# Patient Record
Sex: Male | Born: 1985 | Race: White | Hispanic: No | Marital: Married | Smoking: Current every day smoker
Health system: Southern US, Community
[De-identification: ages and names within clinical notes are randomized; demographics above are authoritative.]

## PROBLEM LIST (undated history)

## (undated) DIAGNOSIS — Z87442 Personal history of urinary calculi: Secondary | ICD-10-CM

---
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
## 2009-10-19 ENCOUNTER — Emergency Department (HOSPITAL_COMMUNITY): Admission: EM | Admit: 2009-10-19 | Discharge: 2009-10-19 | Payer: Self-pay | Admitting: Emergency Medicine

## 2010-03-28 LAB — URINALYSIS, ROUTINE W REFLEX MICROSCOPIC
Glucose, UA: NEGATIVE mg/dL
Specific Gravity, Urine: 1.031 — ABNORMAL HIGH (ref 1.005–1.030)
pH: 7 (ref 5.0–8.0)

## 2010-03-28 LAB — URINE MICROSCOPIC-ADD ON

## 2011-01-19 ENCOUNTER — Emergency Department (HOSPITAL_COMMUNITY): Payer: Self-pay

## 2011-01-19 ENCOUNTER — Emergency Department (HOSPITAL_COMMUNITY)
Admission: EM | Admit: 2011-01-19 | Discharge: 2011-01-19 | Disposition: A | Discharge status: Home / Self Care | Payer: Self-pay | Attending: Emergency Medicine | Admitting: Emergency Medicine

## 2011-01-19 DIAGNOSIS — R51 Headache: Secondary | ICD-10-CM | POA: Insufficient documentation

## 2011-01-19 DIAGNOSIS — S8001XA Contusion of right knee, initial encounter: Secondary | ICD-10-CM

## 2011-01-19 DIAGNOSIS — S0990XA Unspecified injury of head, initial encounter: Secondary | ICD-10-CM | POA: Insufficient documentation

## 2011-01-19 DIAGNOSIS — IMO0002 Reserved for concepts with insufficient information to code with codable children: Secondary | ICD-10-CM | POA: Insufficient documentation

## 2011-01-19 DIAGNOSIS — S8000XA Contusion of unspecified knee, initial encounter: Secondary | ICD-10-CM | POA: Insufficient documentation

## 2011-01-19 DIAGNOSIS — R6884 Jaw pain: Secondary | ICD-10-CM | POA: Insufficient documentation

## 2011-01-19 MED ORDER — HYDROCODONE-ACETAMINOPHEN 5-500 MG PO TABS: 1.0000 | ORAL_TABLET | Freq: Four times a day (QID) | ORAL | Status: AC | PRN

## 2011-01-19 MED ORDER — IBUPROFEN 800 MG PO TABS: 800.0000 mg | ORAL_TABLET | Freq: Three times a day (TID) | ORAL | Status: AC

## 2011-01-19 MED ORDER — HYDROCODONE-ACETAMINOPHEN 5-325 MG PO TABS
1.0000 | ORAL_TABLET | Freq: Once | ORAL | Status: AC
  Administered 2011-01-19: 1 via ORAL
  Filled 2011-01-19: qty 1
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
## 2011-01-19 NOTE — ED Notes (Signed)
Denies dizziness, N/V reports hurts to open mouth- no deformity present upon exam

## 2011-01-20 NOTE — ED Provider Notes (Signed)
Medical screening examination/treatment/procedure(s) were performed by non-physician practitioner and as supervising physician I was immediately available for consultation/collaboration.  Lizette Pazos, MD 01/20/11 0120 
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
## 2013-04-06 ENCOUNTER — Encounter (HOSPITAL_COMMUNITY): Payer: Self-pay | Admitting: Emergency Medicine

## 2013-04-06 ENCOUNTER — Emergency Department (HOSPITAL_COMMUNITY)
Admission: EM | Admit: 2013-04-06 | Discharge: 2013-04-06 | Disposition: A | Discharge status: Home / Self Care | Payer: Self-pay | Attending: Emergency Medicine | Admitting: Emergency Medicine

## 2013-04-06 DIAGNOSIS — S0083XA Contusion of other part of head, initial encounter: Secondary | ICD-10-CM

## 2013-04-06 DIAGNOSIS — S1093XA Contusion of unspecified part of neck, initial encounter: Secondary | ICD-10-CM

## 2013-04-06 DIAGNOSIS — S301XXA Contusion of abdominal wall, initial encounter: Secondary | ICD-10-CM | POA: Insufficient documentation

## 2013-04-06 DIAGNOSIS — T07XXXA Unspecified multiple injuries, initial encounter: Secondary | ICD-10-CM

## 2013-04-06 DIAGNOSIS — S7010XA Contusion of unspecified thigh, initial encounter: Secondary | ICD-10-CM | POA: Insufficient documentation

## 2013-04-06 DIAGNOSIS — S0003XA Contusion of scalp, initial encounter: Secondary | ICD-10-CM | POA: Insufficient documentation

## 2013-04-06 DIAGNOSIS — S7000XA Contusion of unspecified hip, initial encounter: Secondary | ICD-10-CM | POA: Insufficient documentation

## 2013-04-06 DIAGNOSIS — F17200 Nicotine dependence, unspecified, uncomplicated: Secondary | ICD-10-CM | POA: Insufficient documentation

## 2013-04-06 NOTE — Discharge Instructions (Signed)
MULTIPLE AREAS OF ACUTE BRUISING REQUIRE SUPPORTIVE CARE ONLY, WITHOUT SUSPICION OF INTRA-ABDOMINAL OR BONY INJURY. FOLLOW UP WITH YOUR DOCTOR AS NEEDED.   Contusion A contusion is a deep bruise. Contusions are the result of an injury that caused bleeding under the skin. The contusion may turn blue, purple, or yellow. Minor injuries will give you a painless contusion, but more severe contusions may stay painful and swollen for a few weeks.  CAUSES  A contusion is usually caused by a blow, trauma, or direct force to an area of the body. SYMPTOMS   Swelling and redness of the injured area.  Bruising of the injured area.  Tenderness and soreness of the injured area.  Pain. DIAGNOSIS  The diagnosis can be made by taking a history and physical exam. An X-ray, CT scan, or MRI may be needed to determine if there were any associated injuries, such as fractures. TREATMENT  Specific treatment will depend on what area of the body was injured. In general, the best treatment for a contusion is resting, icing, elevating, and applying cold compresses to the injured area. Over-the-counter medicines may also be recommended for pain control. Ask your caregiver what the best treatment is for your contusion. HOME CARE INSTRUCTIONS   Put ice on the injured area.  Put ice in a plastic bag.  Place a towel between your skin and the bag.  Leave the ice on for 15-20 minutes, 03-04 times a day.  Only take over-the-counter or prescription medicines for pain, discomfort, or fever as directed by your caregiver. Your caregiver may recommend avoiding anti-inflammatory medicines (aspirin, ibuprofen, and naproxen) for 48 hours because these medicines may increase bruising.  Rest the injured area.  If possible, elevate the injured area to reduce swelling. SEEK IMMEDIATE MEDICAL CARE IF:   You have increased bruising or swelling.  You have pain that is getting worse.  Your swelling or pain is not relieved with  medicines. MAKE SURE YOU:   Understand these instructions.  Will watch your condition.  Will get help right away if you are not doing well or get worse. Document Released: 10/09/2004 Document Revised: 03/24/2011 Document Reviewed: 11/04/2010 [facility] Patient Information 2014 [place], [place].
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
## 2013-04-07 NOTE — ED Provider Notes (Signed)
Medical screening examination/treatment/procedure(s) were performed by non-physician practitioner and as supervising physician I was immediately available for consultation/collaboration.   EKG Interpretation None        Blanchard Kelch, MD 04/07/13 [phone]

## 2014-06-28 ENCOUNTER — Encounter (HOSPITAL_COMMUNITY): Payer: Self-pay

## 2014-06-28 ENCOUNTER — Emergency Department (HOSPITAL_COMMUNITY)
Admission: EM | Admit: 2014-06-28 | Discharge: 2014-06-28 | Disposition: A | Discharge status: Home / Self Care | Payer: Self-pay | Attending: Emergency Medicine | Admitting: Emergency Medicine

## 2014-06-28 DIAGNOSIS — Z720 Tobacco use: Secondary | ICD-10-CM | POA: Insufficient documentation

## 2014-06-28 DIAGNOSIS — R590 Localized enlarged lymph nodes: Secondary | ICD-10-CM | POA: Insufficient documentation

## 2014-06-28 DIAGNOSIS — R21 Rash and other nonspecific skin eruption: Secondary | ICD-10-CM | POA: Insufficient documentation

## 2014-06-28 MED ORDER — SULFAMETHOXAZOLE-TRIMETHOPRIM 800-160 MG PO TABS: 1.0000 | ORAL_TABLET | Freq: Two times a day (BID) | ORAL | Status: AC

## 2014-06-28 NOTE — ED Provider Notes (Signed)
CSN: [id number]     Arrival date & time 06/28/14  2030 History   First MD Initiated Contact with Patient 06/28/14 2043     Chief Complaint  Patient presents with  . Hair/Scalp Problem     (Consider location/radiation/quality/duration/timing/severity/associated sxs/prior Treatment) HPI Christopher Pennington is a 29 y.o. male who presents to the ED with "bumps" in his head. He states that he noted the areas about 3 months ago and over the past few days he has had green drainage from some of the areas. He states that he feels like the areas are pulsating.   History reviewed. No pertinent past medical history. History reviewed. No pertinent past surgical history. History reviewed. No pertinent family history. History  Substance Use Topics  . Smoking status: Current Every Day Smoker -- 1.00 packs/day    Types: Cigarettes  . Smokeless tobacco: Not on file  . Alcohol Use: Yes     Comment: occasionally    Review of Systems Negative except as stated in HPI   Allergies  Review of patient's allergies indicates no known allergies.  Home Medications   Prior to Admission medications   Medication Sig Start Date End Date Taking? Authorizing Provider  sulfamethoxazole-trimethoprim (BACTRIM DS,SEPTRA DS) 800-160 MG per tablet Take 1 tablet by mouth 2 (two) times daily. 06/28/14 07/05/14  Hope Bunnie Pion, NP   BP 142/83 mmHg  Pulse 73  Temp(Src) 97.7 F (36.5 C) (Oral)  Resp 18  Ht 5\' 10"  (1.778 m)  Wt 200 lb (90.719 kg)  BMI 28.70 kg/m2  SpO2 100% Physical Exam  Constitutional: He is oriented to person, place, and time. He appears well-developed and well-nourished.  HENT:  Multiple tiny red raised areas to the scalp. Few areas with drainage.   Eyes: Conjunctivae and EOM are normal.  Neck: Neck supple.  There are small posterior cervical nodes palpated.   Cardiovascular: Normal rate.   Pulmonary/Chest: Effort normal.  Musculoskeletal: Normal range of motion.  Lymphadenopathy:    He has  cervical adenopathy.  Neurological: He is alert and oriented to person, place, and time. No cranial nerve deficit.  Skin: Skin is warm and dry.  Psychiatric: He has a normal mood and affect. His behavior is normal.  Nursing note and vitals reviewed.   ED Course  Procedures (including critical care time) Labs Review  MDM  29 y.o. male with rash of the scalp that started 3 months ago and now has a few areas that are draining. Stable for d/c to follow up with dermatology. Doubt any immediate emergency exist since this has been an ongoing problem for the past 3 months. Patient is afebrile and does not appear toxic.  Final diagnoses:  Rash and nonspecific skin eruption      Ashley Murrain, NP 06/28/14 2106  Virgel Manifold, MD 06/30/14 1059

## 2014-06-28 NOTE — Discharge Instructions (Signed)
Your exam today shows that you have multiple small red raised bumps to the scalp. Some of the areas look infected. We are starting you on antibiotics and you will need to follow up with the dermatologist for further evaluation of what is causing the rash. Take the medication as directed.

## 2014-06-28 NOTE — ED Notes (Signed)
Patient states that 3 months ago he noticed "bumps" on his head and states they are increasing and having "green puss" draining. Patient states he feels like they are pulsating

## 2014-06-28 NOTE — ED Notes (Signed)
Pt reporting areas on scalp that fill with pus and burst.  Small white areas noted on pt's scalp, approximately the size of pencil tip.

## 2015-02-10 ENCOUNTER — Encounter (HOSPITAL_COMMUNITY): Payer: Self-pay | Admitting: *Deleted

## 2015-02-10 ENCOUNTER — Emergency Department (HOSPITAL_COMMUNITY)
Admission: EM | Admit: 2015-02-10 | Discharge: 2015-02-11 | Disposition: A | Discharge status: Home / Self Care | Payer: Self-pay | Attending: Emergency Medicine | Admitting: Emergency Medicine

## 2015-02-10 ENCOUNTER — Emergency Department (HOSPITAL_COMMUNITY): Payer: Self-pay

## 2015-02-10 DIAGNOSIS — Y998 Other external cause status: Secondary | ICD-10-CM | POA: Insufficient documentation

## 2015-02-10 DIAGNOSIS — S300XXA Contusion of lower back and pelvis, initial encounter: Secondary | ICD-10-CM | POA: Insufficient documentation

## 2015-02-10 DIAGNOSIS — F17210 Nicotine dependence, cigarettes, uncomplicated: Secondary | ICD-10-CM | POA: Insufficient documentation

## 2015-02-10 DIAGNOSIS — Y9289 Other specified places as the place of occurrence of the external cause: Secondary | ICD-10-CM | POA: Insufficient documentation

## 2015-02-10 DIAGNOSIS — T148XXA Other injury of unspecified body region, initial encounter: Secondary | ICD-10-CM

## 2015-02-10 DIAGNOSIS — W19XXXA Unspecified fall, initial encounter: Secondary | ICD-10-CM

## 2015-02-10 DIAGNOSIS — Y9389 Activity, other specified: Secondary | ICD-10-CM | POA: Insufficient documentation

## 2015-02-10 DIAGNOSIS — W08XXXA Fall from other furniture, initial encounter: Secondary | ICD-10-CM | POA: Insufficient documentation

## 2015-02-10 MED ORDER — OXYCODONE-ACETAMINOPHEN 5-325 MG PO TABS
1.0000 | ORAL_TABLET | Freq: Once | ORAL | Status: AC
  Administered 2015-02-10: 1 via ORAL
  Filled 2015-02-10: qty 1

## 2015-02-10 MED ORDER — KETOROLAC TROMETHAMINE 60 MG/2ML IM SOLN
60.0000 mg | Freq: Once | INTRAMUSCULAR | Status: AC
  Administered 2015-02-10: 60 mg via INTRAMUSCULAR
  Filled 2015-02-10: qty 2

## 2015-02-10 NOTE — ED Notes (Signed)
Pt fell off a bench and the leg of the bench struck in him the lower left side of his back.

## 2015-02-10 NOTE — ED Provider Notes (Signed)
CSN: [id number]     Arrival date & time 02/10/15  2310 History  By signing my name below, I, Arianna Nassar, attest that this documentation has been prepared under the direction and in the presence of Orpah Greek, MD. Electronically Signed: Julien Nordmann, ED Scribe. 02/10/2015. 11:26 PM.     Chief Complaint  Patient presents with  . Fall      The history is provided by the patient and the spouse. No language interpreter was used.   HPI Comments: Christopher Pennington is a 30 y.o. male who presents to the Emergency Department complaining of a fall that occurred two hours ago. He complains of left lower back pain that has associated ecchymosis and swelling around the area.  Pt states he was at a party standing on a bench when he fell and the leg of the bench hit him in his left lower back area. Pt notes it is extremely painful to lay down. He is currently under the influence of alcohol. Pt denies pain going down his legs and weakness.  No past medical history on file. No past surgical history on file. No family history on file. Social History  Substance Use Topics  . Smoking status: Current Every Day Smoker -- 1.00 packs/day    Types: Cigarettes  . Smokeless tobacco: Not on file  . Alcohol Use: Yes     Comment: occasionally    Review of Systems  Musculoskeletal: Positive for back pain.  Neurological: Negative for weakness.  All other systems reviewed and are negative.     Allergies  Review of patient's allergies indicates no known allergies.  Home Medications   Prior to Admission medications   Not on File   Triage vitals: BP 124/84 mmHg  Pulse 116  Temp(Src) 97.2 F (36.2 C) (Oral)  Ht 5\' 10"  (1.778 m)  Wt 210 lb (95.255 kg)  BMI 30.13 kg/m2  SpO2 98% Physical Exam  Constitutional: He is oriented to person, place, and time. He appears well-developed and well-nourished. No distress.  HENT:  Head: Normocephalic and atraumatic.  Right Ear: Hearing normal.  Left  Ear: Hearing normal.  Nose: Nose normal.  Mouth/Throat: Oropharynx is clear and moist and mucous membranes are normal.  Eyes: Conjunctivae and EOM are normal. Pupils are equal, round, and reactive to light.  Neck: Normal range of motion. Neck supple.  Cardiovascular: Regular rhythm, S1 normal and S2 normal.  Exam reveals no gallop and no friction rub.   No murmur heard. Pulmonary/Chest: Effort normal and breath sounds normal. No respiratory distress. He exhibits no tenderness.  Abdominal: Soft. Normal appearance and bowel sounds are normal. There is no hepatosplenomegaly. There is no tenderness. There is no rebound, no guarding, no tenderness at McBurney's point and negative Murphy's sign. No hernia.  Musculoskeletal: Normal range of motion. He exhibits tenderness.  4x15 ecchymotic area with surrounding swelling, contusion, and tenderness to the area of the left lower back, no significant midline tenderness  Neurological: He is alert and oriented to person, place, and time. He has normal strength. No cranial nerve deficit or sensory deficit. Coordination normal. GCS eye subscore is 4. GCS verbal subscore is 5. GCS motor subscore is 6.  Skin: Skin is warm, dry and intact. No rash noted. No cyanosis.  Psychiatric: He has a normal mood and affect. His speech is normal and behavior is normal. Thought content normal.  Nursing note and vitals reviewed.   ED Course  Procedures  DIAGNOSTIC STUDIES: Oxygen Saturation is 98% on  RA, normal by my interpretation.  COORDINATION OF CARE:  11:24 PM Discussed treatment plan with pt at bedside and pt agreed to plan.  Labs Review Labs Reviewed - No data to display  Imaging Review No results found. I have personally reviewed and evaluated these images and lab results as part of my medical decision-making.   EKG Interpretation None      MDM   Final diagnoses:  None   contusion Hematoma  Presents for evaluation of injury to left lower back.  Patient fell, landing on a bench directly on the low back. He has a large hematoma and contusion in the region. No blood in his urine to suggest renal contusion. X-ray negative. Patient feeling better with treatment. Continue analgesia, rest, ice.  I personally performed the services described in this documentation, which was scribed in my presence. The recorded information has been reviewed and is accurate.    Orpah Greek, MD 02/11/15 [id number]

## 2015-02-11 LAB — URINALYSIS, ROUTINE W REFLEX MICROSCOPIC
BILIRUBIN URINE: NEGATIVE
GLUCOSE, UA: NEGATIVE mg/dL
Hgb urine dipstick: NEGATIVE
KETONES UR: NEGATIVE mg/dL
Leukocytes, UA: NEGATIVE
NITRITE: NEGATIVE
PH: 5.5 (ref 5.0–8.0)
Protein, ur: NEGATIVE mg/dL
Specific Gravity, Urine: <1.005 — ABNORMAL LOW (ref 1.005–1.030)

## 2015-02-11 MED ORDER — IBUPROFEN 800 MG PO TABS: 800.0000 mg | ORAL_TABLET | Freq: Three times a day (TID) | ORAL | Status: DC

## 2015-02-11 MED ORDER — CYCLOBENZAPRINE HCL 10 MG PO TABS: 10.0000 mg | ORAL_TABLET | Freq: Three times a day (TID) | ORAL | Status: DC | PRN

## 2015-02-11 MED ORDER — HYDROCODONE-ACETAMINOPHEN 5-325 MG PO TABS: 2.0000 | ORAL_TABLET | ORAL | Status: DC | PRN

## 2015-02-11 NOTE — Discharge Instructions (Signed)
Cryotherapy °Cryotherapy means treatment with cold. Ice or gel packs can be used to reduce both pain and swelling. Ice is the most helpful within the first 24 to 48 hours after an injury or flare-up from overusing a muscle or joint. Sprains, strains, spasms, burning pain, shooting pain, and aches can all be eased with ice. Ice can also be used when recovering from surgery. Ice is effective, has very few side effects, and is safe for most people to use. °PRECAUTIONS  °Ice is not a safe treatment option for people with: °· Raynaud phenomenon. This is a condition affecting small blood vessels in the extremities. Exposure to cold may cause your problems to return. °· Cold hypersensitivity. There are many forms of cold hypersensitivity, including: °¨ Cold urticaria. Red, itchy hives appear on the skin when the tissues begin to warm after being iced. °¨ Cold erythema. This is a red, itchy rash caused by exposure to cold. °¨ Cold hemoglobinuria. Red blood cells break down when the tissues begin to warm after being iced. The hemoglobin that carry oxygen are passed into the urine because they cannot combine with blood proteins fast enough. °· Numbness or altered sensitivity in the area being iced. °If you have any of the following conditions, do not use ice until you have discussed cryotherapy with your caregiver: °· Heart conditions, such as arrhythmia, angina, or chronic heart disease. °· High blood pressure. °· Healing wounds or open skin in the area being iced. °· Current infections. °· Rheumatoid arthritis. °· Poor circulation. °· Diabetes. °Ice slows the blood flow in the region it is applied. This is beneficial when trying to stop inflamed tissues from spreading irritating chemicals to surrounding tissues. However, if you expose your skin to cold temperatures for too long or without the proper protection, you can damage your skin or nerves. Watch for signs of skin damage due to cold. °HOME CARE INSTRUCTIONS °Follow  these tips to use ice and cold packs safely. °· Place a dry or damp towel between the ice and skin. A damp towel will cool the skin more quickly, so you may need to shorten the time that the ice is used. °· For a more rapid response, add gentle compression to the ice. °· Ice for no more than 10 to 20 minutes at a time. The bonier the area you are icing, the less time it will take to get the benefits of ice. °· Check your skin after 5 minutes to make sure there are no signs of a poor response to cold or skin damage. °· Rest 20 minutes or more between uses. °· Once your skin is numb, you can end your treatment. You can test numbness by very lightly touching your skin. The touch should be so light that you do not see the skin dimple from the pressure of your fingertip. When using ice, most people will feel these normal sensations in this order: cold, burning, aching, and numbness. °· Do not use ice on someone who cannot communicate their responses to pain, such as small children or people with dementia. °HOW TO MAKE AN ICE PACK °Ice packs are the most common way to use ice therapy. Other methods include ice massage, ice baths, and cryosprays. Muscle creams that cause a cold, tingly feeling do not offer the same benefits that ice offers and should not be used as a substitute unless recommended by your caregiver. °To make an ice pack, do one of the following: °· Place crushed ice or a   bag of frozen vegetables in a sealable plastic bag. Squeeze out the excess air. Place this bag inside another plastic bag. Slide the bag into a pillowcase or place a damp towel between your skin and the bag.  Mix 3 parts water with 1 part rubbing alcohol. Freeze the mixture in a sealable plastic bag. When you remove the mixture from the freezer, it will be slushy. Squeeze out the excess air. Place this bag inside another plastic bag. Slide the bag into a pillowcase or place a damp towel between your skin and the bag. SEEK MEDICAL CARE  IF:  You develop white spots on your skin. This may give the skin a blotchy (mottled) appearance.  Your skin turns blue or pale.  Your skin becomes waxy or hard.  Your swelling gets worse. MAKE SURE YOU:   Understand these instructions.  Will watch your condition.  Will get help right away if you are not doing well or get worse.   This information is not intended to replace advice given to you by your health care provider. Make sure you discuss any questions you have with your health care provider.   Document Released: 08/26/2010 Document Revised: 01/20/2014 Document Reviewed: 08/26/2010 Elsevier Interactive Patient Education 2016 [employer] Hematoma A hematoma is a collection of blood under the skin, in an organ, in a body space, in a joint space, or in other tissue. The blood can clot to form a lump that you can see and feel. The lump is often firm and may sometimes become sore and tender. Most hematomas get better in a few days to weeks. However, some hematomas may be serious and require medical care. Hematomas can range in size from very small to very large. CAUSES  A hematoma can be caused by a blunt or penetrating injury. It can also be caused by spontaneous leakage from a blood vessel under the skin. Spontaneous leakage from a blood vessel is more likely to occur in older people, especially those taking blood thinners. Sometimes, a hematoma can develop after certain medical procedures. SIGNS AND SYMPTOMS   A firm lump on the body.  Possible pain and tenderness in the area.  Bruising.Blue, dark blue, purple-red, or yellowish skin may appear at the site of the hematoma if the hematoma is close to the surface of the skin. For hematomas in deeper tissues or body spaces, the signs and symptoms may be subtle. For example, an intra-abdominal hematoma may cause abdominal pain, weakness, fainting, and shortness of breath. An intracranial hematoma may cause a headache or symptoms  such as weakness, trouble speaking, or a change in consciousness. DIAGNOSIS  A hematoma can usually be diagnosed based on your medical history and a physical exam. Imaging tests may be needed if your health care provider suspects a hematoma in deeper tissues or body spaces, such as the abdomen, head, or chest. These tests may include ultrasonography or a CT scan.  TREATMENT  Hematomas usually go away on their own over time. Rarely does the blood need to be drained out of the body. Large hematomas or those that may affect vital organs will sometimes need surgical drainage or monitoring. HOME CARE INSTRUCTIONS   Apply ice to the injured area:   Put ice in a plastic bag.   Place a towel between your skin and the bag.   Leave the ice on for 20 minutes, 2-3 times a day for the first 1 to 2 days.   After the first 2 days, switch  to using warm compresses on the hematoma.   Elevate the injured area to help decrease pain and swelling. Wrapping the area with an elastic bandage may also be helpful. Compression helps to reduce swelling and promotes shrinking of the hematoma. Make sure the bandage is not wrapped too tight.   If your hematoma is on a lower extremity and is painful, crutches may be helpful for a couple days.   Only take over-the-counter or prescription medicines as directed by your health care provider. SEEK IMMEDIATE MEDICAL CARE IF:   You have increasing pain, or your pain is not controlled with medicine.   You have a fever.   You have worsening swelling or discoloration.   Your skin over the hematoma breaks or starts bleeding.   Your hematoma is in your chest or abdomen and you have weakness, shortness of breath, or a change in consciousness.  Your hematoma is on your scalp (caused by a fall or injury) and you have a worsening headache or a change in alertness or consciousness. MAKE SURE YOU:   Understand these instructions.  Will watch your condition.  Will  get help right away if you are not doing well or get worse.   This information is not intended to replace advice given to you by your health care provider. Make sure you discuss any questions you have with your health care provider.   Document Released: 08/14/2003 Document Revised: 09/01/2012 Document Reviewed: 06/09/2012 Elsevier Interactive Patient Education [employer].

## 2015-02-13 ENCOUNTER — Encounter (HOSPITAL_COMMUNITY): Payer: Self-pay | Admitting: Emergency Medicine

## 2015-02-13 ENCOUNTER — Emergency Department (HOSPITAL_COMMUNITY)
Admission: EM | Admit: 2015-02-13 | Discharge: 2015-02-13 | Disposition: A | Discharge status: Home / Self Care | Payer: Self-pay | Attending: Emergency Medicine | Admitting: Emergency Medicine

## 2015-02-13 DIAGNOSIS — S20229S Contusion of unspecified back wall of thorax, sequela: Secondary | ICD-10-CM

## 2015-02-13 DIAGNOSIS — Z791 Long term (current) use of non-steroidal anti-inflammatories (NSAID): Secondary | ICD-10-CM | POA: Insufficient documentation

## 2015-02-13 DIAGNOSIS — W08XXXS Fall from other furniture, sequela: Secondary | ICD-10-CM | POA: Insufficient documentation

## 2015-02-13 DIAGNOSIS — S300XXS Contusion of lower back and pelvis, sequela: Secondary | ICD-10-CM | POA: Insufficient documentation

## 2015-02-13 DIAGNOSIS — F17210 Nicotine dependence, cigarettes, uncomplicated: Secondary | ICD-10-CM | POA: Insufficient documentation

## 2015-02-13 NOTE — ED Provider Notes (Signed)
CSN: [id number]     Arrival date & time 02/13/15  1906 History   First MD Initiated Contact with Patient 02/13/15 1930     Chief Complaint  Patient presents with  . Back Pain     (Consider location/radiation/quality/duration/timing/severity/associated sxs/prior Treatment) HPI Comments: Patient sustained a fall on January 28. He was standing on a bench and fail. He sustained injury to the lower back, with a bruise. Bruise to the lower back is much larger than on Saturday. Pt states the hematoma is 3 times larger that it was on Saturday. Pt states he has only minimal pain. He worked today and noted the area larger than Saturday at the end of the day.  Patient is a 30 y.o. male presenting with back pain. The history is provided by the patient.  Back Pain Location:  Lumbar spine   History reviewed. No pertinent past medical history. History reviewed. No pertinent past surgical history. History reviewed. No pertinent family history. Social History  Substance Use Topics  . Smoking status: Current Every Day Smoker -- 1.00 packs/day    Types: Cigarettes  . Smokeless tobacco: None  . Alcohol Use: Yes     Comment: occasionally    Review of Systems  Musculoskeletal: Positive for back pain.  All other systems reviewed and are negative.     Allergies  Review of patient's allergies indicates no known allergies.  Home Medications   Prior to Admission medications   Medication Sig Start Date End Date Taking? Authorizing Provider  cyclobenzaprine (FLEXERIL) 10 MG tablet Take 1 tablet (10 mg total) by mouth 3 (three) times daily as needed for muscle spasms. 02/11/15   Orpah Greek, MD  HYDROcodone-acetaminophen (NORCO/VICODIN) 5-325 MG tablet Take 2 tablets by mouth every 4 (four) hours as needed for moderate pain. 02/11/15   Orpah Greek, MD  ibuprofen (ADVIL,MOTRIN) 800 MG tablet Take 1 tablet (800 mg total) by mouth 3 (three) times daily. 02/11/15   Orpah Greek,  MD   BP 133/76 mmHg  Pulse 78  Temp(Src) 97.8 F (36.6 C) (Oral)  Resp 16  Ht 5\' 10"  (1.778 m)  Wt 98.884 kg  BMI 31.28 kg/m2  SpO2 96% Physical Exam  Constitutional: He is oriented to person, place, and time. He appears well-developed and well-nourished.  Non-toxic appearance.  HENT:  Head: Normocephalic.  Right Ear: Tympanic membrane and external ear normal.  Left Ear: Tympanic membrane and external ear normal.  Eyes: EOM and lids are normal. Pupils are equal, round, and reactive to light.  Neck: Normal range of motion. Neck supple. Carotid bruit is not present.  Cardiovascular: Normal rate, regular rhythm, normal heart sounds, intact distal pulses and normal pulses.   Pulmonary/Chest: Breath sounds normal. No respiratory distress.  Abdominal: Soft. Bowel sounds are normal. There is no tenderness. There is no guarding.  Musculoskeletal: Normal range of motion.  Large bruise area of the upper buttocks and extending to the lower back. Palpable hematoma noted. No hot areas or red streaks.  Lymphadenopathy:       Head (right side): No submandibular adenopathy present.       Head (left side): No submandibular adenopathy present.    He has no cervical adenopathy.  Neurological: He is alert and oriented to person, place, and time. He has normal strength. No cranial nerve deficit or sensory deficit.  Pt is ambulatory without problem. No sensory deficit.   Skin: Skin is warm and dry.  Psychiatric: He has a normal mood and  affect. His speech is normal.  Nursing note and vitals reviewed.   ED Course  Procedures (including critical care time) Labs Review Labs Reviewed - No data to display  Imaging Review No results found. I have personally reviewed and evaluated these images and lab results as part of my medical decision-making.   EKG Interpretation None      MDM  Per the patient the bruise that was noted on January 28 is about 3 times larger than it was at that time. The  patient now became concerned because of the increasing in size. He states that the pain is actually somewhat better than it was previously. He is able to ambulate. He states that he went to work today, but after he got home and his family looked at the bruise they were very concerned and demanded that he come to the emergency department. His been no fever or chills reported. No neurologic deficits appreciated according to the patient.  I discussed with the patient in detail that the bruise on may spread, and that it may change colors. We discussed need to return to the emergency department if any temperature elevation, red streaks noted. Any problems with drainage, or changes in his neurologic status. Patient is in agreement with this discharge plan.    Final diagnoses:  Contusion of back, unspecified laterality, sequela    **I have reviewed nursing notes, vital signs, and all appropriate lab and imaging results for this patient.Lily Kocher, PA-C 02/14/15 1653  Noemi Chapel, MD 02/15/15 [phone]

## 2015-02-13 NOTE — Discharge Instructions (Signed)
Ice packs may be helpful. See your MD or return to ED if any fever or red streaks of infection from the hematoma area.

## 2015-02-13 NOTE — ED Notes (Signed)
Pt is worried about bruising to back from fall Saturday. Pt was seen for the same here.

## 2015-04-06 ENCOUNTER — Encounter (HOSPITAL_COMMUNITY): Payer: Self-pay

## 2015-04-06 ENCOUNTER — Emergency Department (HOSPITAL_COMMUNITY)
Admission: EM | Admit: 2015-04-06 | Discharge: 2015-04-06 | Disposition: A | Discharge status: Home / Self Care | Payer: Self-pay | Attending: Emergency Medicine | Admitting: Emergency Medicine

## 2015-04-06 DIAGNOSIS — F17210 Nicotine dependence, cigarettes, uncomplicated: Secondary | ICD-10-CM | POA: Insufficient documentation

## 2015-04-06 DIAGNOSIS — W228XXA Striking against or struck by other objects, initial encounter: Secondary | ICD-10-CM | POA: Insufficient documentation

## 2015-04-06 DIAGNOSIS — Z23 Encounter for immunization: Secondary | ICD-10-CM | POA: Insufficient documentation

## 2015-04-06 DIAGNOSIS — Y9389 Activity, other specified: Secondary | ICD-10-CM | POA: Insufficient documentation

## 2015-04-06 DIAGNOSIS — Y999 Unspecified external cause status: Secondary | ICD-10-CM | POA: Insufficient documentation

## 2015-04-06 DIAGNOSIS — S0990XA Unspecified injury of head, initial encounter: Secondary | ICD-10-CM | POA: Insufficient documentation

## 2015-04-06 DIAGNOSIS — S0181XA Laceration without foreign body of other part of head, initial encounter: Secondary | ICD-10-CM

## 2015-04-06 DIAGNOSIS — Y929 Unspecified place or not applicable: Secondary | ICD-10-CM | POA: Insufficient documentation

## 2015-04-06 DIAGNOSIS — S0121XA Laceration without foreign body of nose, initial encounter: Secondary | ICD-10-CM | POA: Insufficient documentation

## 2015-04-06 MED ORDER — LIDOCAINE HCL (PF) 1 % IJ SOLN
INTRAMUSCULAR | Status: AC
  Administered 2015-04-06: 5 mL
  Filled 2015-04-06: qty 5

## 2015-04-06 MED ORDER — LIDOCAINE HCL (PF) 1 % IJ SOLN
5.0000 mL | Freq: Once | INTRAMUSCULAR | Status: AC
  Administered 2015-04-06: 5 mL

## 2015-04-06 MED ORDER — TETANUS-DIPHTH-ACELL PERTUSSIS 5-2.5-18.5 LF-MCG/0.5 IM SUSP
0.5000 mL | Freq: Once | INTRAMUSCULAR | Status: AC
  Administered 2015-04-06: 0.5 mL via INTRAMUSCULAR
  Filled 2015-04-06: qty 0.5

## 2015-04-06 MED ORDER — BACITRACIN ZINC 500 UNIT/GM EX OINT
TOPICAL_OINTMENT | CUTANEOUS | Status: AC
  Administered 2015-04-06: 1
  Filled 2015-04-06: qty 0.9

## 2015-04-06 NOTE — ED Notes (Signed)
Pt has a laceration to across the bridge of his nose from a light fixture that fell while he was working on it.  Bleeding controlled with pressure applied.

## 2015-04-06 NOTE — Discharge Instructions (Signed)
Facial Laceration ° A facial laceration is a cut on the face. These injuries can be painful and cause bleeding. Lacerations usually heal quickly, but they need special care to reduce scarring. °DIAGNOSIS  °Your health care provider will take a medical history, ask for details about how the injury occurred, and examine the wound to determine how deep the cut is. °TREATMENT  °Some facial lacerations may not require closure. Others may not be able to be closed because of an increased risk of infection. The risk of infection and the chance for successful closure will depend on various factors, including the amount of time since the injury occurred. °The wound may be cleaned to help prevent infection. If closure is appropriate, pain medicines may be given if needed. Your health care provider will use stitches (sutures), wound glue (adhesive), or skin adhesive strips to repair the laceration. These tools bring the skin edges together to allow for faster healing and a better cosmetic outcome. If needed, you may also be given a tetanus shot. °HOME CARE INSTRUCTIONS °· Only take over-the-counter or prescription medicines as directed by your health care provider. °· Follow your health care provider's instructions for wound care. These instructions will vary depending on the technique used for closing the wound. °For Sutures: °· Keep the wound clean and dry.   °· If you were given a bandage (dressing), you should change it at least once a day. Also change the dressing if it becomes wet or dirty, or as directed by your health care provider.   °· Wash the wound with soap and water 2 times a day. Rinse the wound off with water to remove all soap. Pat the wound dry with a clean towel.   °· After cleaning, apply a thin layer of the antibiotic ointment recommended by your health care provider. This will help prevent infection and keep the dressing from sticking.   °· You may shower as usual after the first 24 hours. Do not soak the  wound in water until the sutures are removed.   °· Get your sutures removed as directed by your health care provider. With facial lacerations, sutures should usually be taken out after 4-5 days to avoid stitch marks.   °· Wait a few days after your sutures are removed before applying any makeup. °For Skin Adhesive Strips: °· Keep the wound clean and dry.   °· Do not get the skin adhesive strips wet. You may bathe carefully, using caution to keep the wound dry.   °· If the wound gets wet, pat it dry with a clean towel.   °· Skin adhesive strips will fall off on their own. You may trim the strips as the wound heals. Do not remove skin adhesive strips that are still stuck to the wound. They will fall off in time.   °For Wound Adhesive: °· You may briefly wet your wound in the shower or bath. Do not soak or scrub the wound. Do not swim. Avoid periods of heavy sweating until the skin adhesive has fallen off on its own. After showering or bathing, gently pat the wound dry with a clean towel.   °· Do not apply liquid medicine, cream medicine, ointment medicine, or makeup to your wound while the skin adhesive is in place. This may loosen the film before your wound is healed.   °· If a dressing is placed over the wound, be careful not to apply tape directly over the skin adhesive. This may cause the adhesive to be pulled off before the wound is healed.   °· Avoid   prolonged exposure to sunlight or tanning lamps while the skin adhesive is in place. °· The skin adhesive will usually remain in place for 5-10 days, then naturally fall off the skin. Do not pick at the adhesive film.   °After Healing: °Once the wound has healed, cover the wound with sunscreen during the day for 1 full year. This can help minimize scarring. Exposure to ultraviolet light in the first year will darken the scar. It can take 1-2 years for the scar to lose its redness and to heal completely.  °SEEK MEDICAL CARE IF: °· You have a fever. °SEEK IMMEDIATE  MEDICAL CARE IF: °· You have redness, pain, or swelling around the wound.   °· You see a yellowish-white fluid (pus) coming from the wound.   °  °This information is not intended to replace advice given to you by your health care provider. Make sure you discuss any questions you have with your health care provider. °  °Document Released: 02/07/2004 Document Revised: 01/20/2014 Document Reviewed: 08/12/2012 °Elsevier Interactive Patient Education ©2016 [employer] ° °Head Injury, Adult °You have a head injury. Headaches and throwing up (vomiting) are common after a head injury. It should be easy to wake up from sleeping. Sometimes you must stay in the hospital. Most problems happen within the first 24 hours. Side effects may occur up to 7-10 days after the injury.  °WHAT ARE THE TYPES OF HEAD INJURIES? °Head injuries can be as minor as a bump. Some head injuries can be more severe. More severe head injuries include: °· A jarring injury to the brain (concussion). °· A bruise of the brain (contusion). This mean there is bleeding in the brain that can cause swelling. °· A cracked skull (skull fracture). °· Bleeding in the brain that collects, clots, and forms a bump (hematoma). °WHEN SHOULD I GET HELP RIGHT AWAY?  °· You are confused or sleepy. °· You cannot be woken up. °· You feel sick to your stomach (nauseous) or keep throwing up (vomiting). °· Your dizziness or unsteadiness is getting worse. °· You have very bad, lasting headaches that are not helped by medicine. Take medicines only as told by your doctor. °· You cannot use your arms or legs like normal. °· You cannot walk. °· You notice changes in the black spots in the center of the colored part of your eye (pupil). °· You have clear or bloody fluid coming from your nose or ears. °· You have trouble seeing. °During the next 24 hours after the injury, you must stay with someone who can watch you. This person should get help right away (call 911 in the [place] if  you start to shake and are not able to control it (have seizures), you pass out, or you are unable to wake up. °HOW CAN I PREVENT A HEAD INJURY IN THE FUTURE? °· Wear seat belts. °· Wear a helmet while bike riding and playing sports like football. °· Stay away from dangerous activities around the house. °WHEN CAN I RETURN TO NORMAL ACTIVITIES AND ATHLETICS? °See your doctor before doing these activities. You should not do normal activities or play contact sports until 1 week after the following symptoms have stopped: °· Headache that does not go away. °· Dizziness. °· Poor attention. °· Confusion. °· Memory problems. °· Sickness to your stomach or throwing up. °· Tiredness. °· Fussiness. °· Bothered by bright lights or loud noises. °· Anxiousness or depression. °· Restless sleep. °MAKE SURE YOU:  °· Understand these instructions. °· Will   watch your condition. °· Will get help right away if you are not doing well or get worse. °  °This information is not intended to replace advice given to you by your health care provider. Make sure you discuss any questions you have with your health care provider. °  °Document Released: 12/13/2007 Document Revised: 01/20/2014 Document Reviewed: 09/06/2012 °Elsevier Interactive Patient Education ©2016 Elsevier Inc. ° °

## 2015-04-06 NOTE — ED Provider Notes (Signed)
TIME SEEN: 2:10 AM  CHIEF COMPLAINT: Head injury, facial laceration  HPI: Pt is a 30 y.o. male with no significant past medical history who reports that he was getting into bed tonight and was shaking his bedspread when it hit the light fixture on the ceiling knocking it down striking him in the nose. He denies loss of consciousness. No headache, vomiting, numbness or weakness on one side of his body. Not on anticoagulation or antiplatelet agents. He is unsure when his last tetanus vaccination was. No other injury.  ROS: See HPI Constitutional: no fever  Eyes: no drainage  ENT: no runny nose   Cardiovascular:  no chest pain  Resp: no SOB  GI: no vomiting GU: no dysuria Integumentary: no rash  Allergy: no hives  Musculoskeletal: no leg swelling  Neurological: no slurred speech ROS otherwise negative  PAST MEDICAL HISTORY/PAST SURGICAL HISTORY:  History reviewed. No pertinent past medical history.  MEDICATIONS:  Prior to Admission medications   Medication Sig Start Date End Date Taking? Authorizing Provider  cyclobenzaprine (FLEXERIL) 10 MG tablet Take 1 tablet (10 mg total) by mouth 3 (three) times daily as needed for muscle spasms. 02/11/15   Orpah Greek, MD  HYDROcodone-acetaminophen (NORCO/VICODIN) 5-325 MG tablet Take 2 tablets by mouth every 4 (four) hours as needed for moderate pain. 02/11/15   Orpah Greek, MD  ibuprofen (ADVIL,MOTRIN) 800 MG tablet Take 1 tablet (800 mg total) by mouth 3 (three) times daily. 02/11/15   Orpah Greek, MD    ALLERGIES:  No Known Allergies  SOCIAL HISTORY:  Social History  Substance Use Topics  . Smoking status: Current Every Day Smoker -- 1.00 packs/day    Types: Cigarettes  . Smokeless tobacco: Not on file  . Alcohol Use: Yes     Comment: occasionally    FAMILY HISTORY: No family history on file.  EXAM: BP 122/75 mmHg  Pulse 58  Temp(Src) 97.7 F (36.5 C) (Oral)  Resp 18  Ht 5\' 10"  (1.778 m)  Wt 200  lb (90.719 kg)  BMI 28.70 kg/m2  SpO2 100% CONSTITUTIONAL: Alert and oriented and responds appropriately to questions. Well-appearing; well-nourished HEAD: Normocephalic, patient has a 2.7 cm laceration to the bridge of his nose that is superficial and some swelling and ecchymosis around the nose, otherwise it is atraumatic EYES: Conjunctivae clear, PERRL ENT: normal nose; no rhinorrhea; moist mucous membranes, no dental injury, no septal hematoma, no epistaxis NECK: Supple, no meningismus, no LAD, no midline spinal tenderness or step-off or deformity  CARD: RRR; S1 and S2 appreciated; no murmurs, no clicks, no rubs, no gallops RESP: Normal chest excursion without splinting or tachypnea; breath sounds clear and equal bilaterally; no wheezes, no rhonchi, no rales, no hypoxia or respiratory distress, speaking full sentences ABD/GI: Normal bowel sounds; non-distended; soft, non-tender, no rebound, no guarding, no peritoneal signs BACK:  The back appears normal and is non-tender to palpation, there is no CVA tenderness, no midline spinal tenderness or step-off or deformity EXT: Normal ROM in all joints; non-tender to palpation; no edema; normal capillary refill; no cyanosis, no calf tenderness or swelling    SKIN: Normal color for age and race; warm; no rash NEURO: Moves all extremities equally, sensation to light touch intact diffusely, cranial nerves II through XII intact, normal gait PSYCH: The patient's mood and manner are appropriate. Grooming and personal hygiene are appropriate.  MEDICAL DECISION MAKING: Patient here with head injury. Has a laceration to the bridge of his nose. Discussed  with him that his nose is swollen and could be fractured but doubt any other facial fracture. He has no other facial tenderness on exam. Have offered him imaging of his face but he declines. Doubt intracranial hemorrhage or skull fracture. I do not feel this time he needs a CT of his head. He is neurologically  intact. However. His laceration and updated his tetanus vaccination. Discussed head injury return precautions and discussed laceration care instructions.   I do not feel there is any life-threatening condition present. I have reviewed and discussed all results (EKG, imaging, lab, urine as appropriate), exam findings with patient. I have reviewed nursing notes and appropriate previous records.  I feel the patient is safe to be discharged home without further emergent workup. Discussed usual and customary return precautions. Patient and family (if present) verbalize understanding and are comfortable with this plan.  Patient will follow-up with their primary care provider. If they do not have a primary care provider, information for follow-up has been provided to them. All questions have been answered.      LACERATION REPAIR Performed by: Nyra Jabs Authorized by: Nyra Jabs Consent: Verbal consent obtained. Risks and benefits: risks, benefits and alternatives were discussed Consent given by: patient Patient identity confirmed: provided demographic data Prepped and Draped in normal sterile fashion Wound explored  Laceration Location: Nose  Laceration Length: 2.7 cm  No Foreign Bodies seen or palpated  Anesthesia: local infiltration  Local anesthetic: lidocaine 1 % without epinephrine  Anesthetic total: 3 ml  Irrigation method: syringe Amount of cleaning: standard  Skin closure: Superficial  Number of sutures: 3  Technique: Area anesthetized using lidocaine 1% without epinephrine. Wound irrigated copiously with sterile saline. Wound then cleaned with Betadine and draped in sterile fashion. Wound closed using 3 simple interrupted sutures using 50 absorbable suture.  Bacitracin and sterile dressing applied. Good wound approximation and hemostasis achieved.    Patient tolerance: Patient tolerated the procedure well with no immediate complications.    [place],  DO 04/06/15 [phone]

## 2017-01-31 IMAGING — DX DG LUMBAR SPINE 2-3V
2 series · 2 of 2 positions shown · non-contrast
Comparison: None.

CLINICAL DATA: Status post fall 2 hours ago, with left lower back
pain and ecchymosis. Hit left lower back on leg of bench. Initial
encounter.

EXAM:
LUMBAR SPINE - 2-3 VIEW

[l-spine ap]
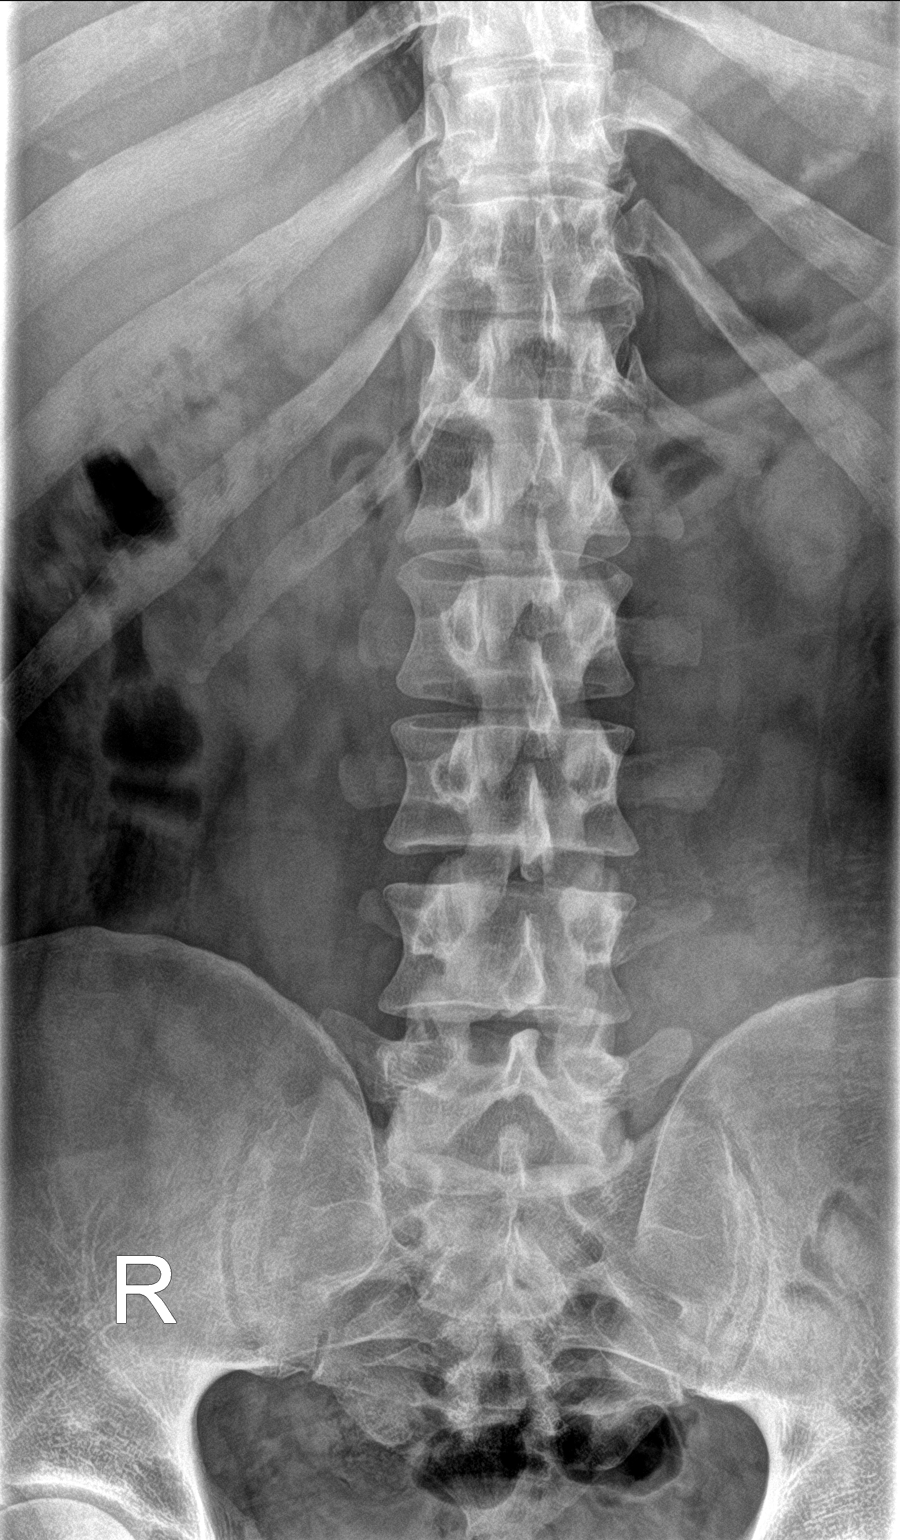

[l-spine obl]
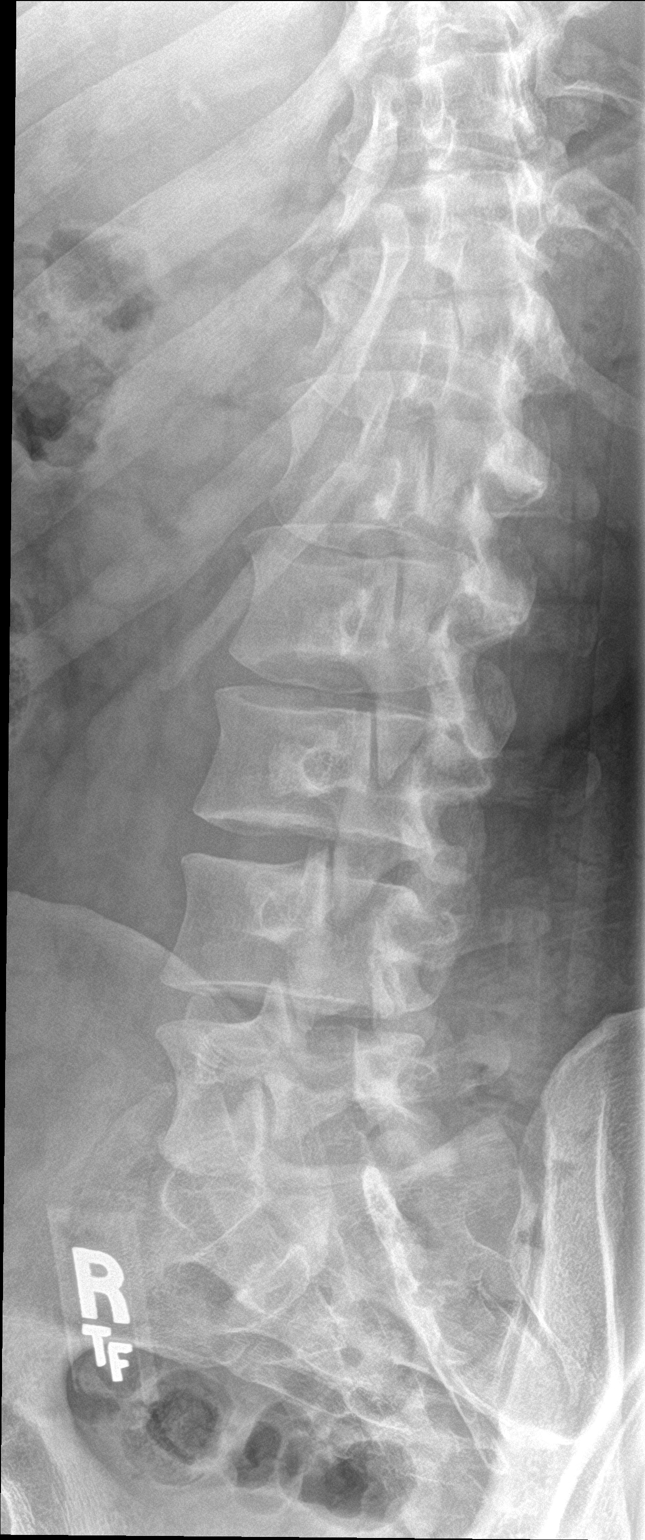

[2 of 2 positions shown; findings below may reference images not displayed]

FINDINGS: There is no evidence of fracture or subluxation. Vertebral bodies
demonstrate normal height and alignment. Intervertebral disc spaces
are preserved. Evaluation is suboptimal as the patient agreed to
only two views -- an AP view and 1 oblique.

The visualized bowel gas pattern is unremarkable in appearance; air
and stool are noted within the colon. The sacroiliac joints are
within normal limits.
IMPRESSION: No evidence of fracture or subluxation along the lumbar spine.
Evaluation is suboptimal as the patient agreed to only the first two
views.

## 2017-04-30 ENCOUNTER — Emergency Department (HOSPITAL_COMMUNITY)
Admission: EM | Admit: 2017-04-30 | Discharge: 2017-05-01 | Disposition: A | Discharge status: Home / Self Care | Payer: Self-pay | Attending: Emergency Medicine | Admitting: Emergency Medicine

## 2017-04-30 ENCOUNTER — Encounter (HOSPITAL_COMMUNITY): Payer: Self-pay

## 2017-04-30 DIAGNOSIS — J029 Acute pharyngitis, unspecified: Secondary | ICD-10-CM | POA: Insufficient documentation

## 2017-04-30 DIAGNOSIS — Z5321 Procedure and treatment not carried out due to patient leaving prior to being seen by health care provider: Secondary | ICD-10-CM | POA: Insufficient documentation

## 2017-04-30 LAB — GROUP A STREP BY PCR: GROUP A STREP BY PCR: DETECTED — AB

## 2017-04-30 NOTE — ED Triage Notes (Signed)
Pt presents with onset of difficulty swallowing that began today.  Pt denies any cold symptoms, reports bilateral ear pain.

## 2018-08-12 ENCOUNTER — Other Ambulatory Visit: Payer: Self-pay

## 2018-08-12 ENCOUNTER — Emergency Department (HOSPITAL_COMMUNITY)
Admission: EM | Admit: 2018-08-12 | Discharge: 2018-08-12 | Disposition: A | Discharge status: Home / Self Care | Payer: Self-pay | Attending: Emergency Medicine | Admitting: Emergency Medicine

## 2018-08-12 DIAGNOSIS — F17210 Nicotine dependence, cigarettes, uncomplicated: Secondary | ICD-10-CM | POA: Insufficient documentation

## 2018-08-12 DIAGNOSIS — L237 Allergic contact dermatitis due to plants, except food: Secondary | ICD-10-CM | POA: Insufficient documentation

## 2018-08-12 MED ORDER — METHYLPREDNISOLONE SODIUM SUCC 125 MG IJ SOLR
125.0000 mg | Freq: Once | INTRAMUSCULAR | Status: AC
  Administered 2018-08-12: 125 mg via INTRAMUSCULAR
  Filled 2018-08-12: qty 2

## 2018-08-12 MED ORDER — PREDNISONE 10 MG (21) PO TBPK: ORAL_TABLET | ORAL | 0 refills | Status: DC

## 2018-08-12 NOTE — Discharge Instructions (Signed)
You may use benadryl 50 mg every 8 hours as needed for itching.  If this medication makes you too drowsy, you can take it before you go to sleep at night.  Steps to find a Primary Care Provider (PCP):  Call [phone] or [phone] to access "[facility]."  2.  You may also go on the [facility] website at [email or web address]  3.  [facility] also frequently accepts new patients.  [place] [phone]  4.  There are also multiple [facility], [facility] and [facility] practices throughout the [place] that are frequently accepting new patients. You may find a clinic that is close to your home and contact them.  [email or web address] [phone]  [facility] [email or web address]  [email or web address] [place] [email or web address] [phone]  5.  Local Health Departments also can provide primary care services.  [facility]  [place] [address] [phone]  [facility] [place] [address] [place] [facility] [place] [phone]

## 2018-08-12 NOTE — ED Provider Notes (Signed)
TIME SEEN: 4:09 AM  CHIEF COMPLAINT: Rash  HPI: Patient is a 33 year old male with no significant past medical history who presents with a diffuse pruritic rash after being exposed to poison ivy several days ago.  Has tried hydrocortisone cream without relief.  Tried Benadryl once but states it made him too drowsy.  No other new exposures including new soaps, lotions, detergents or medications.  States he has had poison ivy before and reports getting a shot of steroids helped him significantly.  He has requesting a dose of IM steroids today.  ROS: See HPI Constitutional: no fever  Eyes: no drainage  ENT: no runny nose   Cardiovascular:  no chest pain  Resp: no SOB  GI: no vomiting GU: no dysuria Integumentary:  rash  Allergy: no hives  Musculoskeletal: no leg swelling  Neurological: no slurred speech ROS otherwise negative  PAST MEDICAL HISTORY/PAST SURGICAL HISTORY:  No past medical history on file.  MEDICATIONS:  Prior to Admission medications   Medication Sig Start Date End Date Taking? Authorizing Provider  cyclobenzaprine (FLEXERIL) 10 MG tablet Take 1 tablet (10 mg total) by mouth 3 (three) times daily as needed for muscle spasms. 02/11/15   Orpah Greek, MD  HYDROcodone-acetaminophen (NORCO/VICODIN) 5-325 MG tablet Take 2 tablets by mouth every 4 (four) hours as needed for moderate pain. 02/11/15   Orpah Greek, MD  ibuprofen (ADVIL,MOTRIN) 800 MG tablet Take 1 tablet (800 mg total) by mouth 3 (three) times daily. 02/11/15   Orpah Greek, MD    ALLERGIES:  No Known Allergies  SOCIAL HISTORY:  Social History   Tobacco Use  . Smoking status: Current Every Day Smoker    Packs/day: 1.00    Types: Cigarettes  . Smokeless tobacco: Never Used  Substance Use Topics  . Alcohol use: Yes    Comment: occasionally    FAMILY HISTORY: No family history on file.  EXAM: BP 130/76   Pulse (!) 56   Temp 98.2 F (36.8 C) (Oral)   Resp 16   SpO2  99%  CONSTITUTIONAL: Alert and oriented and responds appropriately to questions. Well-appearing; well-nourished HEAD: Normocephalic EYES: Conjunctivae clear, pupils appear equal, EOMI ENT: normal nose; moist mucous membranes NECK: Supple, no meningismus, no nuchal rigidity, no LAD  CARD: RRR; S1 and S2 appreciated; no murmurs, no clicks, no rubs, no gallops RESP: Normal chest excursion without splinting or tachypnea; breath sounds clear and equal bilaterally; no wheezes, no rhonchi, no rales, no hypoxia or respiratory distress, speaking full sentences ABD/GI: Normal bowel sounds; non-distended; soft, non-tender, no rebound, no guarding, no peritoneal signs, no hepatosplenomegaly BACK:  The back appears normal and is non-tender to palpation, there is no CVA tenderness EXT: Normal ROM in all joints; non-tender to palpation; no edema; normal capillary refill; no cyanosis, no calf tenderness or swelling    SKIN: Normal color for age and race; warm; diffuse erythematous raised macular lesions with some excoriation without signs of superimposed infection consistent with dermatitis to the face, extremities, torso, no rash on the palms or a mucous membranes, no blisters or desquamation, no urticaria no petechiae or purpura NEURO: Moves all extremities equally PSYCH: The patient's mood and manner are appropriate. Grooming and personal hygiene are appropriate.  MEDICAL DECISION MAKING: Patient here with contact dermatitis from poison ivy.  Will give dose of IM steroids and discharged with steroid taper.  Have recommended Benadryl at night before bedtime.  No signs of superimposed infection.  I feel he is safe  to be discharged home.  At this time, I do not feel there is any life-threatening condition present. I have reviewed and discussed all results (EKG, imaging, lab, urine as appropriate) and exam findings with patient/family. I have reviewed nursing notes and appropriate previous records.  I feel the  patient is safe to be discharged home without further emergent workup and can continue workup as an outpatient as needed. Discussed usual and customary return precautions. Patient/family verbalize understanding and are comfortable with this plan.  Outpatient follow-up has been provided as needed. All questions have been answered.      Sera Hitsman, [place], DO 08/12/18 [phone]

## 2018-08-12 NOTE — ED Triage Notes (Signed)
Pt c/o poison oak rash all over x1 week.

## 2018-11-02 ENCOUNTER — Encounter (HOSPITAL_COMMUNITY): Payer: Self-pay | Admitting: *Deleted

## 2018-11-02 ENCOUNTER — Other Ambulatory Visit: Payer: Self-pay

## 2018-11-02 ENCOUNTER — Emergency Department (HOSPITAL_COMMUNITY)
Admission: EM | Admit: 2018-11-02 | Discharge: 2018-11-02 | Disposition: A | Discharge status: Home / Self Care | Payer: Self-pay | Attending: Emergency Medicine | Admitting: Emergency Medicine

## 2018-11-02 ENCOUNTER — Emergency Department (HOSPITAL_COMMUNITY): Payer: Self-pay

## 2018-11-02 DIAGNOSIS — F17210 Nicotine dependence, cigarettes, uncomplicated: Secondary | ICD-10-CM | POA: Insufficient documentation

## 2018-11-02 DIAGNOSIS — R112 Nausea with vomiting, unspecified: Secondary | ICD-10-CM | POA: Insufficient documentation

## 2018-11-02 DIAGNOSIS — R319 Hematuria, unspecified: Secondary | ICD-10-CM | POA: Insufficient documentation

## 2018-11-02 DIAGNOSIS — N130 Hydronephrosis with ureteropelvic junction obstruction: Secondary | ICD-10-CM | POA: Insufficient documentation

## 2018-11-02 DIAGNOSIS — N201 Calculus of ureter: Secondary | ICD-10-CM

## 2018-11-02 LAB — COMPREHENSIVE METABOLIC PANEL
ALT: 35 U/L (ref 0–44)
AST: 30 U/L (ref 15–41)
Albumin: 4.1 g/dL (ref 3.5–5.0)
Alkaline Phosphatase: 89 U/L (ref 38–126)
Anion gap: 12 (ref 5–15)
BUN: 20 mg/dL (ref 6–20)
CO2: 25 mmol/L (ref 22–32)
Calcium: 9.7 mg/dL (ref 8.9–10.3)
Chloride: 101 mmol/L (ref 98–111)
Creatinine, Ser: 1.12 mg/dL (ref 0.61–1.24)
GFR calc Af Amer: >60 mL/min (ref >60)
GFR calc non Af Amer: >60 mL/min (ref >60)
Glucose, Bld: 118 mg/dL — ABNORMAL HIGH (ref 70–99)
Potassium: 3.6 mmol/L (ref 3.5–5.1)
Sodium: 138 mmol/L (ref 135–145)
Total Bilirubin: 0.3 mg/dL (ref 0.3–1.2)
Total Protein: 7.3 g/dL (ref 6.5–8.1)

## 2018-11-02 LAB — URINALYSIS, ROUTINE W REFLEX MICROSCOPIC
Bacteria, UA: NONE SEEN
Bilirubin Urine: NEGATIVE
Glucose, UA: NEGATIVE mg/dL
Ketones, ur: NEGATIVE mg/dL
Leukocytes,Ua: NEGATIVE
Nitrite: NEGATIVE
Protein, ur: 100 mg/dL — AB
RBC / HPF: >50 RBC/hpf — ABNORMAL HIGH (ref 0–5)
Specific Gravity, Urine: 1.023 (ref 1.005–1.030)
pH: 8 (ref 5.0–8.0)

## 2018-11-02 LAB — CBC
HCT: 42.5 % (ref 39.0–52.0)
Hemoglobin: 14.3 g/dL (ref 13.0–17.0)
MCH: 29.7 pg (ref 26.0–34.0)
MCHC: 33.6 g/dL (ref 30.0–36.0)
MCV: 88.2 fL (ref 80.0–100.0)
Platelets: 241 10*3/uL (ref 150–400)
RBC: 4.82 MIL/uL (ref 4.22–5.81)
RDW: 12.6 % (ref 11.5–15.5)
WBC: 8.8 10*3/uL (ref 4.0–10.5)
nRBC: 0 % (ref 0.0–0.2)

## 2018-11-02 LAB — LIPASE, BLOOD: Lipase: 25 U/L (ref 11–51)

## 2018-11-02 MED ORDER — HYDROMORPHONE HCL 1 MG/ML IJ SOLN
1.0000 mg | Freq: Once | INTRAMUSCULAR | Status: AC
  Administered 2018-11-02: 04:00:00 1 mg via INTRAVENOUS
  Filled 2018-11-02: qty 1

## 2018-11-02 MED ORDER — ONDANSETRON HCL 4 MG/2ML IJ SOLN
4.0000 mg | Freq: Once | INTRAMUSCULAR | Status: AC
  Administered 2018-11-02: 03:00:00 4 mg via INTRAVENOUS
  Filled 2018-11-02: qty 2

## 2018-11-02 MED ORDER — SODIUM CHLORIDE 0.9% FLUSH
3.0000 mL | Freq: Once | INTRAVENOUS | Status: AC
  Administered 2018-11-02: 03:00:00 3 mL via INTRAVENOUS

## 2018-11-02 MED ORDER — TAMSULOSIN HCL 0.4 MG PO CAPS: 0.4000 mg | ORAL_CAPSULE | Freq: Every day | ORAL | 0 refills | Status: DC

## 2018-11-02 MED ORDER — OXYCODONE-ACETAMINOPHEN 5-325 MG PO TABS: 1.0000 | ORAL_TABLET | ORAL | 0 refills | Status: DC | PRN

## 2018-11-02 MED ORDER — KETOROLAC TROMETHAMINE 30 MG/ML IJ SOLN
30.0000 mg | Freq: Once | INTRAMUSCULAR | Status: AC
  Administered 2018-11-02: 04:00:00 30 mg via INTRAVENOUS
  Filled 2018-11-02: qty 1

## 2018-11-02 MED ORDER — OXYCODONE-ACETAMINOPHEN 5-325 MG PO TABS: 1.0000 | ORAL_TABLET | ORAL | Status: DC | PRN

## 2018-11-02 MED ORDER — HYDROMORPHONE HCL 1 MG/ML IJ SOLN
1.0000 mg | Freq: Once | INTRAMUSCULAR | Status: AC
  Administered 2018-11-02: 03:00:00 1 mg via INTRAVENOUS
  Filled 2018-11-02: qty 1

## 2018-11-02 MED ORDER — ONDANSETRON 4 MG PO TBDP: 4.0000 mg | ORAL_TABLET | Freq: Once | ORAL | Status: DC

## 2018-11-02 MED ORDER — ONDANSETRON 4 MG PO TBDP: 4.0000 mg | ORAL_TABLET | Freq: Three times a day (TID) | ORAL | 0 refills | Status: DC | PRN

## 2018-11-02 NOTE — ED Notes (Signed)
Patient transported to CT scan . 

## 2018-11-02 NOTE — Discharge Instructions (Signed)
You have 65mm stone on left, this should pass on its own.  Likely within the next day or so. May continue ot have some blood in the urine which can be expected with stones. Take the prescribed medication as directed.  Do not drive while taking pain medication. Follow-up with urology-- call clinic for appt. Return here for any new/acute changes.

## 2018-11-02 NOTE — ED Provider Notes (Signed)
[facility] EMERGENCY DEPARTMENT Provider Note   CSN: [id number] Arrival date & time: 11/02/18  [id number]     History   Chief Complaint Chief Complaint  Patient presents with  . Abdominal Pain    HPI Christopher Pennington is a 33 y.o. male.     The history is provided by the patient and medical records.  Abdominal Pain Associated symptoms: nausea and vomiting      33 year old male here with sudden onset left flank pain about 4 hours ago.  He reports 2 episodes of nonbloody, nonbilious emesis.  Pain now seems to be radiating to his left lower abdomen and towards the groin.  He denies any testicle pain or swelling.  He has had some noted hematuria and is unable to get comfortable.  No fever or chills.  No history of kidney stones.  No prior abdominal surgeries.  No meds prior to arrival.  History reviewed. No pertinent past medical history.  There are no active problems to display for this patient.   History reviewed. No pertinent surgical history.      Home Medications    Prior to Admission medications   Medication Sig Start Date End Date Taking? Authorizing Provider  cyclobenzaprine (FLEXERIL) 10 MG tablet Take 1 tablet (10 mg total) by mouth 3 (three) times daily as needed for muscle spasms. 02/11/15   Orpah Greek, MD  HYDROcodone-acetaminophen (NORCO/VICODIN) 5-325 MG tablet Take 2 tablets by mouth every 4 (four) hours as needed for moderate pain. 02/11/15   Orpah Greek, MD  ibuprofen (ADVIL,MOTRIN) 800 MG tablet Take 1 tablet (800 mg total) by mouth 3 (three) times daily. 02/11/15   Orpah Greek, MD  predniSONE (STERAPRED UNI-PAK 21 TAB) 10 MG (21) TBPK tablet Take as directed 08/12/18   Ward, Delice Bison, DO    Family History No family history on file.  Social History Social History   Tobacco Use  . Smoking status: Current Every Day Smoker    Packs/day: 1.00    Types: Cigarettes  . Smokeless tobacco: Never Used  Substance  Use Topics  . Alcohol use: Yes    Comment: occasionally  . Drug use: No     Allergies   Patient has no known allergies.   Review of Systems Review of Systems  Gastrointestinal: Positive for nausea and vomiting.  Genitourinary: Positive for flank pain.  All other systems reviewed and are negative.    Physical Exam Updated Vital Signs BP (!) 146/80   Pulse (!) 55   Temp 98.8 F (37.1 C)   Resp 18   SpO2 100%   Physical Exam Vitals signs and nursing note reviewed.  Constitutional:      Appearance: He is well-developed.     Comments: Writhing around on stretcher, intermittently standing up, unable to sit still, moaning  HENT:     Head: Normocephalic and atraumatic.  Eyes:     Conjunctiva/sclera: Conjunctivae normal.     Pupils: Pupils are equal, round, and reactive to light.  Neck:     Musculoskeletal: Normal range of motion.  Cardiovascular:     Rate and Rhythm: Normal rate and regular rhythm.     Heart sounds: Normal heart sounds.  Pulmonary:     Effort: Pulmonary effort is normal.     Breath sounds: Normal breath sounds.  Abdominal:     General: Bowel sounds are normal.     Palpations: Abdomen is soft.     Tenderness: There is no abdominal  tenderness. There is left CVA tenderness. There is no guarding or rebound.     Comments: Left CVA tenderness  Musculoskeletal: Normal range of motion.  Skin:    General: Skin is warm and dry.  Neurological:     Mental Status: He is alert and oriented to person, place, and time.      ED Treatments / Results  Labs (all labs ordered are listed, but only abnormal results are displayed) Labs Reviewed  COMPREHENSIVE METABOLIC PANEL - Abnormal; Notable for the following components:      Result Value   Glucose, Bld 118 (*)    All other components within normal limits  URINALYSIS, ROUTINE W REFLEX MICROSCOPIC - Abnormal; Notable for the following components:   APPearance CLOUDY (*)    Hgb urine dipstick LARGE (*)     Protein, ur 100 (*)    RBC / HPF >50 (*)    All other components within normal limits  LIPASE, BLOOD  CBC    EKG None  Radiology Ct Renal Stone Study  Result Date: 11/02/2018 CLINICAL DATA:  Left flank pain and vomiting. "Writhing in bed." EXAM: CT ABDOMEN AND PELVIS WITHOUT CONTRAST TECHNIQUE: Multidetector CT imaging of the abdomen and pelvis was performed following the standard protocol without IV contrast. COMPARISON:  None. FINDINGS: Lower chest: Mild hypoventilatory changes in the dependent lung bases. No pleural fluid. Hepatobiliary: No focal liver abnormality is seen. No gallstones, gallbladder wall thickening, or biliary dilatation. Pancreas: No ductal dilatation or inflammation. Spleen: Normal in size without focal abnormality. Small splenule anteriorly. Adrenals/Urinary Tract: Normal adrenal glands. Obstructing 4 mm stone at the left ureterovesicular junction with mild hydroureteronephrosis. Mild left perinephric edema. No additional stones in either kidney. No right hydronephrosis. Right ureter is decompressed. Urinary bladder minimally distended. No bladder stone. Stomach/Bowel: Stomach is within normal limits. Appendix appears normal. No evidence of bowel wall thickening, distention, or inflammatory changes. Vascular/Lymphatic: Abdominal aorta is normal in caliber. Retroaortic left renal vein. No abdominopelvic adenopathy. Reproductive: Prostate is unremarkable. Other: No free air, free fluid, or intra-abdominal fluid collection. Musculoskeletal: Mild degenerative disc disease at L5-S1. IMPRESSION: Obstructing 4 mm stone at the left ureterovesicular junction with mild hydronephrosis. Electronically Signed   By: Keith Rake M.D.   On: 11/02/2018 04:22    Procedures Procedures (including critical care time)  Medications Ordered in ED Medications  sodium chloride flush (NS) 0.9 % injection 3 mL (3 mLs Intravenous Given 11/02/18 0303)  HYDROmorphone (DILAUDID) injection 1 mg (1  mg Intravenous Given 11/02/18 0303)  ondansetron (ZOFRAN) injection 4 mg (4 mg Intravenous Given 11/02/18 0304)  HYDROmorphone (DILAUDID) injection 1 mg (1 mg Intravenous Given 11/02/18 0419)  ketorolac (TORADOL) 30 MG/ML injection 30 mg (30 mg Intravenous Given 11/02/18 0419)     Initial Impression / Assessment and Plan / ED Course  I have reviewed the triage vital signs and the nursing notes.  Pertinent labs & imaging results that were available during my care of the patient were reviewed by me and considered in my medical decision making (see chart for details).  32 year old male here with sudden onset left flank and abdominal pain 4 hours prior to arrival.  Has had a few episodes of nausea and vomiting.  On exam he appears very uncomfortable, moaning, and unable to sit still.  He does report some hematuria earlier.  Strong suspicion for kidney stone.  Labs are pending.  Will obtain CT renal study and give pain medication.  Labs overall reassuring with normal renal  function.  UA without signs of infection, large blood.  Some transient relief with initial Dilaudid, but recurrent pain after returning from CT scan.  Will give additional medications.  No longer vomiting.  4:43 AM Patient much more comfortable after additional medications, a little drowsy currently.  CT does reveal 50mm stone at the UVJ junction with mild hydronephrosis.  Will let him metabolize a bit.  5:57 AM Patient has been resting comfortably, now tolerating sprite without issue.  Pain remains controlled.  Feel he stable for discharge home.  Given urology follow-up.  Rx Percocet, Zofran, Flomax.  Encourage good oral hydration.  May return here for any new or acute changes.  Final Clinical Impressions(s) / ED Diagnoses   Final diagnoses:  Left ureteral stone  Hematuria, unspecified type    ED Discharge Orders         Ordered    oxyCODONE-acetaminophen (PERCOCET) 5-325 MG tablet  Every 4 hours PRN     11/02/18 0554     ondansetron (ZOFRAN ODT) 4 MG disintegrating tablet  Every 8 hours PRN     11/02/18 0554    tamsulosin (FLOMAX) 0.4 MG CAPS capsule  Daily after supper     11/02/18 [place], Lisa M, PA-C 11/02/18 [place], Delice Bison, DO 11/02/18 [phone]

## 2018-11-02 NOTE — ED Triage Notes (Signed)
Pt reports back pain yesterday with severe L sided abd that started tonight ~4hours ago with two episodes of vomiting tonight. Pain radiates into testicles.

## 2019-06-12 ENCOUNTER — Other Ambulatory Visit: Payer: Self-pay

## 2019-06-12 ENCOUNTER — Emergency Department (HOSPITAL_COMMUNITY)
Admission: EM | Admit: 2019-06-12 | Discharge: 2019-06-12 | Disposition: A | Discharge status: Home / Self Care | Payer: Self-pay | Attending: Emergency Medicine | Admitting: Emergency Medicine

## 2019-06-12 ENCOUNTER — Encounter (HOSPITAL_COMMUNITY): Payer: Self-pay | Admitting: Emergency Medicine

## 2019-06-12 DIAGNOSIS — Z5321 Procedure and treatment not carried out due to patient leaving prior to being seen by health care provider: Secondary | ICD-10-CM | POA: Insufficient documentation

## 2019-06-12 DIAGNOSIS — R69 Illness, unspecified: Secondary | ICD-10-CM | POA: Insufficient documentation

## 2019-06-12 NOTE — ED Notes (Signed)
The pt notified registration staff that he had to leave
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
## 2019-06-25 ENCOUNTER — Encounter (HOSPITAL_COMMUNITY): Payer: Self-pay

## 2019-06-25 ENCOUNTER — Other Ambulatory Visit: Payer: Self-pay

## 2019-06-25 ENCOUNTER — Ambulatory Visit (HOSPITAL_COMMUNITY)
Admission: EM | Admit: 2019-06-25 | Discharge: 2019-06-25 | Disposition: A | Discharge status: Home / Self Care | Payer: Self-pay

## 2019-06-25 DIAGNOSIS — R1031 Right lower quadrant pain: Secondary | ICD-10-CM

## 2019-06-25 NOTE — Discharge Instructions (Addendum)
Go to ED with any worsening symptoms as discussed. Follow up with PCP for further evaluation of possible inguinal hernia

## 2019-06-25 NOTE — ED Triage Notes (Signed)
Pt presents with possible hernia to groin area; pt states he has groin/lower pelvis pain on right side and pulling sensation X 3 weeks.
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
## 2019-08-07 ENCOUNTER — Encounter (HOSPITAL_COMMUNITY): Payer: Self-pay | Admitting: Emergency Medicine

## 2019-08-07 ENCOUNTER — Emergency Department (HOSPITAL_COMMUNITY)
Admission: EM | Admit: 2019-08-07 | Discharge: 2019-08-08 | Disposition: A | Discharge status: Home / Self Care | Payer: Self-pay | Attending: Emergency Medicine | Admitting: Emergency Medicine

## 2019-08-07 ENCOUNTER — Other Ambulatory Visit: Payer: Self-pay

## 2019-08-07 DIAGNOSIS — R1909 Other intra-abdominal and pelvic swelling, mass and lump: Secondary | ICD-10-CM | POA: Insufficient documentation

## 2019-08-07 DIAGNOSIS — Z5321 Procedure and treatment not carried out due to patient leaving prior to being seen by health care provider: Secondary | ICD-10-CM | POA: Insufficient documentation
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
## 2019-08-08 ENCOUNTER — Other Ambulatory Visit: Payer: Self-pay

## 2019-08-08 ENCOUNTER — Encounter (HOSPITAL_BASED_OUTPATIENT_CLINIC_OR_DEPARTMENT_OTHER): Payer: Self-pay | Admitting: *Deleted

## 2019-08-08 ENCOUNTER — Emergency Department (HOSPITAL_COMMUNITY): Payer: Self-pay

## 2019-08-08 ENCOUNTER — Emergency Department (HOSPITAL_BASED_OUTPATIENT_CLINIC_OR_DEPARTMENT_OTHER)
Admission: EM | Admit: 2019-08-08 | Discharge: 2019-08-08 | Disposition: A | Discharge status: Home / Self Care | Payer: Self-pay | Attending: Emergency Medicine | Admitting: Emergency Medicine

## 2019-08-08 DIAGNOSIS — Z79899 Other long term (current) drug therapy: Secondary | ICD-10-CM | POA: Insufficient documentation

## 2019-08-08 DIAGNOSIS — K4090 Unilateral inguinal hernia, without obstruction or gangrene, not specified as recurrent: Secondary | ICD-10-CM | POA: Insufficient documentation

## 2019-08-08 DIAGNOSIS — N50811 Right testicular pain: Secondary | ICD-10-CM | POA: Insufficient documentation

## 2019-08-08 DIAGNOSIS — F17210 Nicotine dependence, cigarettes, uncomplicated: Secondary | ICD-10-CM | POA: Insufficient documentation

## 2019-08-08 LAB — URINALYSIS, ROUTINE W REFLEX MICROSCOPIC
Bilirubin Urine: NEGATIVE
Glucose, UA: NEGATIVE mg/dL
Hgb urine dipstick: NEGATIVE
Ketones, ur: NEGATIVE mg/dL
Leukocytes,Ua: NEGATIVE
Nitrite: NEGATIVE
Protein, ur: NEGATIVE mg/dL
Specific Gravity, Urine: 1.025 (ref 1.005–1.030)
pH: 6 (ref 5.0–8.0)

## 2019-08-08 MED ORDER — HYDROCODONE-ACETAMINOPHEN 5-325 MG PO TABS
2.0000 | ORAL_TABLET | Freq: Once | ORAL | Status: AC
  Administered 2019-08-08: 2 via ORAL
  Filled 2019-08-08: qty 2

## 2019-08-08 MED ORDER — OXYCODONE-ACETAMINOPHEN 5-325 MG PO TABS
1.0000 | ORAL_TABLET | Freq: Once | ORAL | Status: AC
  Administered 2019-08-08: 1 via ORAL
  Filled 2019-08-08: qty 1

## 2019-08-08 MED ORDER — KETOROLAC TROMETHAMINE 60 MG/2ML IM SOLN
60.0000 mg | Freq: Once | INTRAMUSCULAR | Status: AC
  Administered 2019-08-08: 60 mg via INTRAMUSCULAR
  Filled 2019-08-08: qty 2

## 2019-08-08 NOTE — ED Provider Notes (Signed)
  Physical Exam  BP (!) 132/87 (BP Location: Left Arm)   Pulse 89   Temp 97.6 F (36.4 C) (Oral)   Resp 17   Ht 5\' 10"  (1.778 m)   Wt 90.7 kg   SpO2 97%   BMI 28.70 kg/m   Physical Exam Abdominal:     Palpations: Abdomen is soft.     Tenderness: There is abdominal tenderness.     Comments: Egg sized right inguinal  Hernia tender but easily reducible, normal skin   Genitourinary:    Testes:        Right: Tenderness present.     Comments: Right testicular tenderness, normal testicular positioning. Skin normal.     ED Course/Procedures   Clinical Course as of Aug 07 344  Mon Aug 08, 2019  0317 Right epididymis: 5 mm cyst is noted within the right epididymis.  Left epididymis: Normal in size and appearance.  Hydrocele: None visualized.  Varicocele: None visualized.  Pulsed Doppler interrogation of both testes demonstrates normal low resistance arterial and venous waveforms bilaterally.  Incidental note is made of fat containing right inguinal hernia.  IMPRESSION: Normal-appearing testicles. No evidence of torsion.  Fat containing right inguinal hernia.  US SCROTUM W/DOPPLER [CG]    Clinical Course User Index [CG] Kinnie Feil, PA-C    Procedures  MDM   8115: [place] as above. Discussed right testicular cyst, fat containing hernia with patient and otherwise normal [place].  He is concerned about amount of pain for 3 months. Wants surgery today. No indication for emergency hernia repair. Has follow up with [facility] on 8/9.  Return precautions given.       Kinnie Feil, PA-C 08/08/19 [id number]    Rolland Porter, MD 08/08/19 [phone]
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
## 2019-08-08 NOTE — ED Notes (Signed)
Report to [facility], CN at [facility] ED

## 2019-08-08 NOTE — ED Notes (Signed)
Pt's wife is transporting him to [facility] ED.

## 2019-08-08 NOTE — ED Notes (Signed)
Edp Steinl to triage to assess pt

## 2019-08-08 NOTE — Discharge Instructions (Addendum)
Go directly to [facility] for ultrasound right testicle.   For hernia, follow up with general surgeon in 1 week - call office tomorrow to arrange appointment.  If hernia becomes persistently painful, and stuck out (not reducible), and/or is associating with abdominal pain, distension, or vomiting  - return to ER.   Tylenol or ibuprofen for pain  Avoid straining, heavy lifting

## 2019-08-11 ENCOUNTER — Ambulatory Visit: Payer: Self-pay | Admitting: General Surgery

## 2019-08-11 NOTE — Patient Instructions (Addendum)
DUE TO COVID-19 ONLY ONE VISITOR ARE ALLOWED TO COME WITH YOU AND STAY IN THE WAITING ROOM ONLY DURING PRE OP AND PROCEDURE. THEN TWO VISITORS MAY VISIT WITH YOU IN YOUR PRIVATE ROOM DURING VISITING HOURS ONLY!! (10AM-8PM)   COVID SWAB TESTING MUST BE COMPLETED ON:    Saturday, 08-13-19 @ [address] [place] -[facility] enter pre surgical testing line (Must self quarantine after testing. Follow instructions on handout.)               Your procedure is scheduled on: 08-17-19   Report to [facility] Main  Entrance   Report to admitting at 10:00 AM   Call this number if you have problems the morning of surgery [phone]   Do not eat food :After Midnight.   May have liquids until  9:00 AM  day of surgery   CLEAR LIQUID DIET  Foods Allowed                                                                     Foods Excluded  Water, Black Coffee and tea, regular and decaf            liquids that you cannot  Plain Jell-O in any flavor  (No red)                                   see through such as: Fruit ices (not with fruit pulp)                                      milk, soups, orange juice              Iced Popsicles (No red)                                      All solid food                                   Apple juices Sports drinks like Gatorade (No red) Lightly seasoned clear broth or consume(fat free) Sugar, honey syrup     Complete one Ensure drink the morning of surgery at 9:00 AM  the day of surgery.     Oral Hygiene is also important to reduce your risk of infection.                                     Remember - BRUSH YOUR TEETH THE MORNING OF SURGERY WITH YOUR REGULAR TOOTHPASTE.  AFTERWARDS NO WATER, GUM,  CANDY OR MINTS   Do NOT smoke after Midnight   Take these medicines the morning of surgery with A SIP OF WATER:  None  You may not have any metal on your body including jewelry, and body  piercings              Do not wear make-up, lotions, powders, cologne, or deodorant                       Men may shave face and neck.   Do not bring valuables to the hospital. [place].   Contacts, dentures or bridgework may not be worn into surgery.   Patients discharged the day of surgery will not be allowed to drive home.   Special Instructions: Bring a copy of your healthcare power of attorney and living will documents the day of surgery if you haven't scanned them  in  before.              Please read over the following fact sheets you were given: IF YOU HAVE QUESTIONS ABOUT YOUR PRE [place]  [phone]  [place] - Preparing for Surgery Before surgery, you can play an important role.  Because skin is not sterile, your skin needs to be as free of germs as possible.  You can reduce the number of germs on your skin by washing with CHG (chlorahexidine gluconate) soap before surgery.  CHG is an antiseptic cleaner which kills germs and bonds with the skin to continue killing germs even after washing. Please DO NOT use if you have an allergy to CHG or antibacterial soaps.  If your skin becomes reddened/irritated stop using the CHG and inform your nurse when you arrive at Short Stay. Do not shave (including legs and underarms) for at least 48 hours prior to the first CHG shower.  You may shave your face/neck.  Please follow these instructions carefully:  1.  Shower with CHG Soap the night before surgery and the  morning of surgery.  2.  If you choose to wash your hair, wash your hair first as usual with your normal  shampoo.  3.  After you shampoo, rinse your hair and body thoroughly to remove the shampoo.                             4.  Use CHG as you would any other liquid soap.  You can apply chg directly to the skin and wash.  Gently with a scrungie or clean washcloth.  5.  Apply the CHG Soap to your body ONLY FROM THE  NECK DOWN.   Do   not use on face/ open                           Wound or open sores. Avoid contact with eyes, ears mouth and   genitals (private parts).                       Wash face,  Genitals (private parts) with your normal soap.             6.  Wash thoroughly, paying special attention to the area where your    surgery  will be performed.  7.  Thoroughly rinse your body with warm water from the neck down.  8.  DO NOT shower/wash with your normal soap after using and rinsing off the CHG Soap.  9.  Pat yourself dry with a clean towel.            10.  Wear clean pajamas.            11.  Place clean sheets on your bed the night of your first shower and do not  sleep with pets. Day of Surgery : Do not apply any lotions/deodorants the morning of surgery.  Please wear clean clothes to the hospital/surgery center.  FAILURE TO FOLLOW THESE INSTRUCTIONS MAY RESULT IN THE CANCELLATION OF YOUR SURGERY  PATIENT SIGNATURE_________________________________  NURSE SIGNATURE__________________________________  ________________________________________________________________________
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
## 2019-08-11 NOTE — Progress Notes (Addendum)
COVID Vaccine Completed: No Date COVID Vaccine completed: COVID vaccine manufacturer: [employer]   PCP - N/A Cardiologist - N/A  No Back Stimulator  Chest x-ray - N/A EKG - N/A Stress Test - N/A ECHO - N/A Cardiac Cath - N/A  Sleep Study - N/A CPAP -   Fasting Blood Sugar - N/A Checks Blood Sugar _____ times a day  Blood Thinner Instructions:N/A Aspirin Instructions:N/A Last Dose:  Anesthesia review:   Patient denies shortness of breath, fever, cough and chest pain at PAT appointment   Patient verbalized understanding of instructions that were given to them at the PAT appointment. Patient was also instructed that they will need to review over the PAT instructions again at home before surgery.
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
## 2019-08-12 ENCOUNTER — Other Ambulatory Visit: Payer: Self-pay

## 2019-08-12 ENCOUNTER — Encounter (HOSPITAL_COMMUNITY): Payer: Self-pay

## 2019-08-12 ENCOUNTER — Encounter (HOSPITAL_COMMUNITY)
Admission: RE | Admit: 2019-08-12 | Discharge: 2019-08-12 | Disposition: A | Discharge status: Home / Self Care | Payer: Self-pay | Source: Ambulatory Visit | Attending: General Surgery | Admitting: General Surgery

## 2019-08-12 DIAGNOSIS — Z23 Encounter for immunization: Secondary | ICD-10-CM | POA: Insufficient documentation

## 2019-08-12 HISTORY — DX: Personal history of urinary calculi: Z87.442

## 2019-08-12 LAB — CBC
HCT: 47.8 % (ref 39.0–52.0)
Hemoglobin: 15.7 g/dL (ref 13.0–17.0)
MCH: 30 pg (ref 26.0–34.0)
MCHC: 32.8 g/dL (ref 30.0–36.0)
MCV: 91.2 fL (ref 80.0–100.0)
Platelets: 250 10*3/uL (ref 150–400)
RBC: 5.24 MIL/uL (ref 4.22–5.81)
RDW: 13 % (ref 11.5–15.5)
WBC: 5.5 10*3/uL (ref 4.0–10.5)
nRBC: 0 % (ref 0.0–0.2)

## 2019-08-13 ENCOUNTER — Other Ambulatory Visit (HOSPITAL_COMMUNITY)
Admission: RE | Admit: 2019-08-13 | Discharge: 2019-08-13 | Disposition: A | Discharge status: Home / Self Care | Payer: HRSA Program | Source: Ambulatory Visit | Attending: General Surgery | Admitting: General Surgery

## 2019-08-13 DIAGNOSIS — Z01812 Encounter for preprocedural laboratory examination: Secondary | ICD-10-CM | POA: Insufficient documentation

## 2019-08-13 DIAGNOSIS — Z20822 Contact with and (suspected) exposure to covid-19: Secondary | ICD-10-CM | POA: Insufficient documentation

## 2019-08-13 LAB — SARS CORONAVIRUS 2 (TAT 6-24 HRS): SARS Coronavirus 2: NEGATIVE

## 2019-08-17 ENCOUNTER — Other Ambulatory Visit: Payer: Self-pay

## 2019-08-17 ENCOUNTER — Ambulatory Visit (HOSPITAL_COMMUNITY)
Admission: RE | Admit: 2019-08-17 | Discharge: 2019-08-17 | Disposition: A | Discharge status: Home / Self Care | Payer: 59 | Attending: General Surgery | Admitting: General Surgery

## 2019-08-17 ENCOUNTER — Encounter (HOSPITAL_COMMUNITY): Payer: Self-pay | Admitting: General Surgery

## 2019-08-17 ENCOUNTER — Ambulatory Visit (HOSPITAL_COMMUNITY): Payer: 59 | Admitting: Certified Registered Nurse Anesthetist

## 2019-08-17 ENCOUNTER — Encounter (HOSPITAL_COMMUNITY)
Admission: RE | Disposition: A | Discharge status: Home / Self Care | Payer: Self-pay | Source: Home / Self Care | Attending: General Surgery

## 2019-08-17 DIAGNOSIS — D176 Benign lipomatous neoplasm of spermatic cord: Secondary | ICD-10-CM | POA: Diagnosis not present

## 2019-08-17 DIAGNOSIS — K4090 Unilateral inguinal hernia, without obstruction or gangrene, not specified as recurrent: Secondary | ICD-10-CM | POA: Insufficient documentation

## 2019-08-17 DIAGNOSIS — F17200 Nicotine dependence, unspecified, uncomplicated: Secondary | ICD-10-CM | POA: Diagnosis not present

## 2019-08-17 DIAGNOSIS — F319 Bipolar disorder, unspecified: Secondary | ICD-10-CM | POA: Insufficient documentation

## 2019-08-17 HISTORY — PX: INGUINAL HERNIA REPAIR: SHX194

## 2019-08-17 SURGERY — REPAIR, HERNIA, INGUINAL, LAPAROSCOPIC
Anesthesia: General | Site: Abdomen | Laterality: Right

## 2019-08-17 MED ORDER — DEXAMETHASONE SODIUM PHOSPHATE 10 MG/ML IJ SOLN
INTRAMUSCULAR | Status: AC
  Filled 2019-08-17: qty 1

## 2019-08-17 MED ORDER — BUPIVACAINE-EPINEPHRINE 0.25% -1:200000 IJ SOLN
INTRAMUSCULAR | Status: DC | PRN
  Administered 2019-08-17: 8 mL

## 2019-08-17 MED ORDER — OXYCODONE HCL 5 MG/5ML PO SOLN: 5.0000 mg | Freq: Once | ORAL | Status: AC | PRN

## 2019-08-17 MED ORDER — CHLORHEXIDINE GLUCONATE 0.12 % MT SOLN
15.0000 mL | Freq: Once | OROMUCOSAL | Status: AC
  Administered 2019-08-17: 15 mL via OROMUCOSAL

## 2019-08-17 MED ORDER — MIDAZOLAM HCL 2 MG/2ML IJ SOLN
INTRAMUSCULAR | Status: AC
  Filled 2019-08-17: qty 2

## 2019-08-17 MED ORDER — CHLORHEXIDINE GLUCONATE CLOTH 2 % EX PADS: 6.0000 | MEDICATED_PAD | Freq: Once | CUTANEOUS | Status: DC

## 2019-08-17 MED ORDER — ONDANSETRON HCL 4 MG/2ML IJ SOLN: 4.0000 mg | Freq: Once | INTRAMUSCULAR | Status: DC | PRN

## 2019-08-17 MED ORDER — SUGAMMADEX SODIUM 200 MG/2ML IV SOLN
INTRAVENOUS | Status: DC | PRN
  Administered 2019-08-17: 200 mg via INTRAVENOUS

## 2019-08-17 MED ORDER — DEXMEDETOMIDINE HCL 200 MCG/2ML IV SOLN
INTRAVENOUS | Status: DC | PRN
  Administered 2019-08-17: 4 ug via INTRAVENOUS
  Administered 2019-08-17 (×2): 8 ug via INTRAVENOUS

## 2019-08-17 MED ORDER — OXYCODONE HCL 5 MG PO TABS
5.0000 mg | ORAL_TABLET | Freq: Once | ORAL | Status: AC | PRN
  Administered 2019-08-17: 5 mg via ORAL

## 2019-08-17 MED ORDER — FENTANYL CITRATE (PF) 100 MCG/2ML IJ SOLN
INTRAMUSCULAR | Status: DC | PRN
  Administered 2019-08-17: 25 ug via INTRAVENOUS
  Administered 2019-08-17: 100 ug via INTRAVENOUS
  Administered 2019-08-17: 25 ug via INTRAVENOUS
  Administered 2019-08-17 (×2): 50 ug via INTRAVENOUS

## 2019-08-17 MED ORDER — OXYCODONE HCL 5 MG PO TABS
5.0000 mg | ORAL_TABLET | Freq: Once | ORAL | Status: AC
  Administered 2019-08-17: 5 mg via ORAL

## 2019-08-17 MED ORDER — ONDANSETRON HCL 4 MG/2ML IJ SOLN
INTRAMUSCULAR | Status: AC
  Filled 2019-08-17: qty 2

## 2019-08-17 MED ORDER — ACETAMINOPHEN 500 MG PO TABS
1000.0000 mg | ORAL_TABLET | ORAL | Status: AC
  Administered 2019-08-17: 1000 mg via ORAL
  Filled 2019-08-17: qty 2

## 2019-08-17 MED ORDER — OXYCODONE HCL 5 MG PO TABS
ORAL_TABLET | ORAL | Status: AC
  Filled 2019-08-17: qty 1

## 2019-08-17 MED ORDER — PROPOFOL 10 MG/ML IV BOLUS
INTRAVENOUS | Status: DC | PRN
  Administered 2019-08-17: 200 mg via INTRAVENOUS

## 2019-08-17 MED ORDER — AMISULPRIDE (ANTIEMETIC) 5 MG/2ML IV SOLN: 10.0000 mg | Freq: Once | INTRAVENOUS | Status: DC | PRN

## 2019-08-17 MED ORDER — DEXAMETHASONE SODIUM PHOSPHATE 10 MG/ML IJ SOLN
INTRAMUSCULAR | Status: DC | PRN
  Administered 2019-08-17: 10 mg via INTRAVENOUS

## 2019-08-17 MED ORDER — CEFAZOLIN SODIUM-DEXTROSE 2-4 GM/100ML-% IV SOLN
2.0000 g | INTRAVENOUS | Status: AC
  Administered 2019-08-17: 2 g via INTRAVENOUS
  Filled 2019-08-17: qty 100

## 2019-08-17 MED ORDER — MIDAZOLAM HCL 2 MG/2ML IJ SOLN
INTRAMUSCULAR | Status: DC | PRN
  Administered 2019-08-17: 2 mg via INTRAVENOUS

## 2019-08-17 MED ORDER — ROCURONIUM BROMIDE 10 MG/ML (PF) SYRINGE
PREFILLED_SYRINGE | INTRAVENOUS | Status: AC
  Filled 2019-08-17: qty 10

## 2019-08-17 MED ORDER — LIDOCAINE 2% (20 MG/ML) 5 ML SYRINGE
INTRAMUSCULAR | Status: AC
  Filled 2019-08-17: qty 5

## 2019-08-17 MED ORDER — 0.9 % SODIUM CHLORIDE (POUR BTL) OPTIME
TOPICAL | Status: DC | PRN
  Administered 2019-08-17: 1000 mL

## 2019-08-17 MED ORDER — ONDANSETRON HCL 4 MG/2ML IJ SOLN
INTRAMUSCULAR | Status: DC | PRN
  Administered 2019-08-17: 4 mg via INTRAVENOUS

## 2019-08-17 MED ORDER — FENTANYL CITRATE (PF) 100 MCG/2ML IJ SOLN
25.0000 ug | INTRAMUSCULAR | Status: DC | PRN
  Administered 2019-08-17 (×3): 50 ug via INTRAVENOUS

## 2019-08-17 MED ORDER — FENTANYL CITRATE (PF) 100 MCG/2ML IJ SOLN
INTRAMUSCULAR | Status: AC
  Filled 2019-08-17: qty 2

## 2019-08-17 MED ORDER — ENSURE PRE-SURGERY PO LIQD
296.0000 mL | Freq: Once | ORAL | Status: DC
  Filled 2019-08-17: qty 296

## 2019-08-17 MED ORDER — LACTATED RINGERS IV SOLN: INTRAVENOUS | Status: DC

## 2019-08-17 MED ORDER — KETOROLAC TROMETHAMINE 30 MG/ML IJ SOLN: 30.0000 mg | Freq: Once | INTRAMUSCULAR | Status: DC

## 2019-08-17 MED ORDER — TRAMADOL HCL 50 MG PO TABS: 50.0000 mg | ORAL_TABLET | Freq: Four times a day (QID) | ORAL | 0 refills | Status: AC | PRN

## 2019-08-17 MED ORDER — ORAL CARE MOUTH RINSE: 15.0000 mL | Freq: Once | OROMUCOSAL | Status: AC

## 2019-08-17 MED ORDER — LIDOCAINE 2% (20 MG/ML) 5 ML SYRINGE
INTRAMUSCULAR | Status: DC | PRN
  Administered 2019-08-17: 80 mg via INTRAVENOUS

## 2019-08-17 MED ORDER — BUPIVACAINE-EPINEPHRINE (PF) 0.25% -1:200000 IJ SOLN
INTRAMUSCULAR | Status: AC
  Filled 2019-08-17: qty 30

## 2019-08-17 MED ORDER — ROCURONIUM BROMIDE 10 MG/ML (PF) SYRINGE
PREFILLED_SYRINGE | INTRAVENOUS | Status: DC | PRN
  Administered 2019-08-17: 50 mg via INTRAVENOUS

## 2019-08-17 MED ORDER — FENTANYL CITRATE (PF) 250 MCG/5ML IJ SOLN
INTRAMUSCULAR | Status: AC
  Filled 2019-08-17: qty 5

## 2019-08-17 MED ORDER — GABAPENTIN 300 MG PO CAPS
300.0000 mg | ORAL_CAPSULE | ORAL | Status: AC
  Administered 2019-08-17: 300 mg via ORAL
  Filled 2019-08-17: qty 1

## 2019-08-17 SURGICAL SUPPLY — 41 items
ADH SKN CLS APL DERMABOND .7 (GAUZE/BANDAGES/DRESSINGS) ×1
APL PRP STRL LF DISP 70% ISPRP (MISCELLANEOUS) ×1
APPLIER CLIP 5 13 M/L LIGAMAX5 (MISCELLANEOUS)
APR CLP MED LRG 5 ANG JAW (MISCELLANEOUS)
CABLE HIGH FREQUENCY MONO STRZ (ELECTRODE) ×3 IMPLANT
CATH FOLEY 3WAY  5CC 16FR (CATHETERS)
CATH FOLEY 3WAY 5CC 16FR (CATHETERS) ×1 IMPLANT
CHLORAPREP W/TINT 26 (MISCELLANEOUS) ×3 IMPLANT
CLIP APPLIE 5 13 M/L LIGAMAX5 (MISCELLANEOUS) IMPLANT
DECANTER SPIKE VIAL GLASS SM (MISCELLANEOUS) ×3 IMPLANT
DERMABOND ADVANCED (GAUZE/BANDAGES/DRESSINGS) ×2
DERMABOND ADVANCED .7 DNX12 (GAUZE/BANDAGES/DRESSINGS) ×1 IMPLANT
ELECT REM PT RETURN 15FT ADLT (MISCELLANEOUS) ×3 IMPLANT
ENDOLOOP SUT PDS II  0 18 (SUTURE)
ENDOLOOP SUT PDS II 0 18 (SUTURE) IMPLANT
GLOVE BIO SURGEON STRL SZ7.5 (GLOVE) ×3 IMPLANT
GOWN STRL REUS W/TWL XL LVL3 (GOWN DISPOSABLE) ×6 IMPLANT
KIT BASIN OR (CUSTOM PROCEDURE TRAY) ×3 IMPLANT
KIT TURNOVER KIT A (KITS) IMPLANT
NDL INSUFFLATION 14GA 120MM (NEEDLE) IMPLANT
NEEDLE INSUFFLATION 14GA 120MM (NEEDLE) ×3 IMPLANT
PLUG CATH AND CAP STER (CATHETERS) ×3 IMPLANT
PROTECTOR NERVE ULNAR (MISCELLANEOUS) IMPLANT
ProGrip Laparoscopic self-fixating Mesh ×2 IMPLANT
RELOAD STAPLE 4.0 BLU F/HERNIA (INSTRUMENTS) ×1 IMPLANT
RELOAD STAPLE 4.8 BLK F/HERNIA (STAPLE) IMPLANT
RELOAD STAPLE HERNIA 4.0 BLUE (INSTRUMENTS) ×3 IMPLANT
RELOAD STAPLE HERNIA 4.8 BLK (STAPLE) IMPLANT
SCISSORS LAP 5X35 DISP (ENDOMECHANICALS) IMPLANT
SET IRRIG TUBING LAPAROSCOPIC (IRRIGATION / IRRIGATOR) IMPLANT
SET IRRIG Y TYPE TUR BLADDER L (SET/KITS/TRAYS/PACK) ×3 IMPLANT
SET TUBE SMOKE EVAC HIGH FLOW (TUBING) ×3 IMPLANT
STAPLER HERNIA 12 8.5 360D (INSTRUMENTS) ×3 IMPLANT
SUT MNCRL AB 4-0 PS2 18 (SUTURE) ×3 IMPLANT
TOWEL OR 17X26 10 PK STRL BLUE (TOWEL DISPOSABLE) ×3 IMPLANT
TOWEL OR NON WOVEN STRL DISP B (DISPOSABLE) ×3 IMPLANT
TRAY FOLEY MTR SLVR 14FR STAT (SET/KITS/TRAYS/PACK) ×3 IMPLANT
TRAY LAPAROSCOPIC (CUSTOM PROCEDURE TRAY) ×3 IMPLANT
TROCAR OPTICAL SHORT 5MM (TROCAR) IMPLANT
TROCAR OPTICAL SLV SHORT 5MM (TROCAR) ×3 IMPLANT
TROCAR XCEL 12X100 BLDLESS (ENDOMECHANICALS) ×3 IMPLANT

## 2019-08-17 NOTE — Discharge Instructions (Signed)
[facility] _______[facility] °INGUINAL HERNIA REPAIR: POST OP INSTRUCTIONS ° °Always review your discharge instruction sheet given to you by the facility where your surgery was performed. °IF YOU HAVE DISABILITY OR FAMILY LEAVE FORMS, YOU MUST BRING THEM TO THE OFFICE FOR PROCESSING.   °DO NOT GIVE THEM TO YOUR DOCTOR. ° °1. A  prescription for pain medication may be given to you upon discharge.  Take your pain medication as prescribed, if needed.  If narcotic pain medicine is not needed, then you may take acetaminophen (Tylenol) or ibuprofen (Advil) as needed. °2. Take your usually prescribed medications unless otherwise directed. °If you need a refill on your pain medication, please contact your pharmacy.  They will contact our office to request authorization. Prescriptions will not be filled after 5 pm or on week-ends. °3. You should follow a light diet the first 24 hours after arrival home, such as soup and crackers, etc.  Be sure to include lots of fluids daily.  Resume your normal diet the day after surgery. °4.Most patients will experience some swelling and bruising around the umbilicus or in the groin and scrotum.  Ice packs and reclining will help.  Swelling and bruising can take several days to resolve.  °6. It is common to experience some constipation if taking pain medication after surgery.  Increasing fluid intake and taking a stool softener (such as Colace) will usually help or prevent this problem from occurring.  A mild laxative (Milk of Magnesia or Miralax) should be taken according to package directions if there are no bowel movements after 48 hours. °7. Unless discharge instructions indicate otherwise, you may remove your bandages 24-48 hours after surgery, and you may shower at that time.  You may have steri-strips (small skin tapes) in place directly over the incision.  These strips should be left on the skin for 7-10 days.  If your surgeon used skin glue on the incision, you may  shower in 24 hours.  The glue will flake off over the next 2-3 weeks.  Any sutures or staples will be removed at the office during your follow-up visit. °8. ACTIVITIES:  You may resume regular (light) daily activities beginning the next day--such as daily self-care, walking, climbing stairs--gradually increasing activities as tolerated.  You may have sexual intercourse when it is comfortable.  Refrain from any heavy lifting or straining until approved by your doctor. ° °a.You may drive when you are no longer taking prescription pain medication, you can comfortably wear a seatbelt, and you can safely maneuver your car and apply brakes. °b.RETURN TO WORK:   °_____________________________________________ ° °9.You should see your doctor in the office for a follow-up appointment approximately 2-3 weeks after your surgery.  Make sure that you call for this appointment within a day or two after you arrive home to insure a convenient appointment time. °10.OTHER INSTRUCTIONS: _________________________ °   _____________________________________ ° °WHEN TO CALL YOUR DOCTOR: °1. Fever over 101.0 °2. Inability to urinate °3. Nausea and/or vomiting °4. Extreme swelling or bruising °5. Continued bleeding from incision. °6. Increased pain, redness, or drainage from the incision ° °The clinic staff is available to answer your questions during regular business hours.  Please don’t hesitate to call and ask to speak to one of the nurses for clinical concerns.  If you have a medical emergency, go to the nearest emergency room or call 911.  A surgeon from [facility] is always on call at the hospital ° ° °[address], [address], [place], [place]  [address] ? ° [address] [address], [place], [place]   [address] °[phone] ? [phone] ? FAX [phone] °Web site: [email or web address] ° °

## 2019-08-17 NOTE — Interval H&P Note (Signed)
History and Physical Interval Note:  08/17/2019 10:41 AM  Christopher Pennington  has presented today for surgery, with the diagnosis of RIGHT INGUINAL HERNIA.  The various methods of treatment have been discussed with the patient and family. After consideration of risks, benefits and other options for treatment, the patient has consented to  Procedure(s): LAPAROSCOPIC RIGHT INGUINAL HERNIA REPAIR WITH MESH (Right) as a surgical intervention.  The patient's history has been reviewed, patient examined, no change in status, stable for surgery.  I have reviewed the patient's chart and labs.  Questions were answered to the patient's satisfaction.     Ralene Ok

## 2019-08-17 NOTE — Anesthesia Preprocedure Evaluation (Signed)
Anesthesia Evaluation  Patient identified by MRN, date of birth, ID band Patient awake    Reviewed: Allergy & Precautions, NPO status , Patient's Chart, lab work & pertinent test results  History of Anesthesia Complications Negative for: history of anesthetic complications  Airway Mallampati: I  TM Distance: >3 FB Neck ROM: Full    Dental  (+) Teeth Intact   Pulmonary Current Smoker and Patient abstained from smoking.,    Pulmonary exam normal        Cardiovascular negative cardio ROS Normal cardiovascular exam     Neuro/Psych negative neurological ROS  negative psych ROS   GI/Hepatic negative GI ROS, Neg liver ROS,   Endo/Other  negative endocrine ROS  Renal/GU negative Renal ROS  negative genitourinary   Musculoskeletal negative musculoskeletal ROS (+)   Abdominal   Peds  Hematology negative hematology ROS (+)   Anesthesia Other Findings   Reproductive/Obstetrics                             Anesthesia Physical Anesthesia Plan  ASA: II  Anesthesia Plan: General   Post-op Pain Management:    Induction: Intravenous  PONV Risk Score and Plan: 2 and Ondansetron, Dexamethasone, Treatment may vary due to age or medical condition and Midazolam  Airway Management Planned: Oral ETT  Additional Equipment: None  Intra-op Plan:   Post-operative Plan: Extubation in OR  Informed Consent: I have reviewed the patients History and Physical, chart, labs and discussed the procedure including the risks, benefits and alternatives for the proposed anesthesia with the patient or authorized representative who has indicated his/her understanding and acceptance.     Dental advisory given  Plan Discussed with:   Anesthesia Plan Comments:         Anesthesia Quick Evaluation

## 2019-08-17 NOTE — Anesthesia Postprocedure Evaluation (Signed)
Anesthesia Post Note  Patient: Christopher Pennington  Procedure(s) Performed: LAPAROSCOPIC RIGHT INGUINAL HERNIA REPAIR WITH MESH (Right Abdomen)     Patient location during evaluation: PACU Anesthesia Type: General Level of consciousness: awake and alert Pain management: pain level controlled Vital Signs Assessment: post-procedure vital signs reviewed and stable Respiratory status: spontaneous breathing, nonlabored ventilation and respiratory function stable Cardiovascular status: blood pressure returned to baseline and stable Postop Assessment: no apparent nausea or vomiting Anesthetic complications: no   No complications documented.  Last Vitals:  Vitals:   08/17/19 1345 08/17/19 1415  BP: (!) 143/96 (!) 146/92  Pulse: (!) 46 (!) 50  Resp: 14 16  Temp:  36.7 C  SpO2: 100% 100%    Last Pain:  Vitals:   08/17/19 1415  TempSrc:   PainSc: 7                  Christopher Pennington

## 2019-08-17 NOTE — Op Note (Signed)
08/17/2019  12:09 PM  PATIENT:  Christopher Pennington  34 y.o. male  PRE-OPERATIVE DIAGNOSIS:  RIGHT INGUINAL HERNIA  POST-OPERATIVE DIAGNOSIS:  RIGHT INDIRECT INGUINAL HERNIA WITH CORD LIPOMA  PROCEDURE:  Procedure(s): LAPAROSCOPIC RIGHT INGUINAL HERNIA REPAIR WITH MESH (Right)  SURGEON:  Surgeon(s) and Role:    * Ralene Ok, MD - Primary  ANESTHESIA:   local and general  EBL:  5 mL   BLOOD ADMINISTERED:none  DRAINS: none   LOCAL MEDICATIONS USED:  BUPIVICAINE   SPECIMEN:  No Specimen  DISPOSITION OF SPECIMEN:  N/A  COUNTS:  YES  TOURNIQUET:  * No tourniquets in log *  DICTATION: .Dragon Dictation    Counts: reported as correct x 2  Findings:  The patient had a large right indirect hernia  Indications for procedure:  The patient is a 34 year old male with a right hernia for several months. Patient complained of symptomatology to his right inguinal area. The patient was taken back for elective inguinal hernia repair.  Details of the procedure: The patient was taken back to the operating room. The patient was placed in supine position with bilateral SCDs in place.  The patient was prepped and draped in the usual sterile fashion.  After appropriate anitbiotics were confirmed, a time-out was confirmed and all facts were verified.  0.25% Marcaine was used to infiltrate the umbilical area. A 11-blade was used to cut down the skin and blunt dissection was used to get the anterior fashion.  The anterior fascia was incised approximately 1 cm and the muscles were retracted laterally. Blunt dissection was then used to create a space in the preperitoneal area. At this time a 10 mm camera was then introduced into the space and advanced the pubic tubercle and a 12 mm trocar was placed over this and insufflation was started.  At this time and space was created from medial to laterally the preperitoneal space.  Cooper's ligament was initially cleaned off.  The hernia sac was identified  in the indirect space. Dissection of the hernia sac was undertaken the vas deferens was identified and protected in all parts of the case.  There was a small tear into the hernia sac. A Veress needle right upper quadrant to help evacuate the intraperitoneal air.    Once the hernia sac was taken down to approximately the umbilicus a Covidein laparoscopic Progrip Rachelle Hora, was  introduced into the preperitoneal space.  The mesh was brought over to cover the direct and indirect hernia spaces.   The hernia sac was seen lying posterior to the mesh.  The tear in the peritoneum was reapproximated with 4.38mm staples from a Universal hernia stapler. The insufflation was evacuated and the peritoneum was seen posterior to the mesh. The trochars were removed. The anterior fascia was reapproximated using #1 Vicryl on a UR- 6.  Intra-abdominal air was evacuated and the Veress needle removed. The skin was reapproximated using 4-0 Monocryl subcuticular fashion and dressed with Dermbond.  The patient was awakened from general anesthesia and taken to recovery in stable condition.   PLAN OF CARE: Discharge to home after PACU  PATIENT DISPOSITION:  PACU - hemodynamically stable.   Delay start of Pharmacological VTE agent (>24hrs) due to surgical blood loss or risk of bleeding: not applicable

## 2019-08-17 NOTE — Transfer of Care (Signed)
Immediate Anesthesia Transfer of Care Note  Patient: Christopher Pennington  Procedure(s) Performed: LAPAROSCOPIC RIGHT INGUINAL HERNIA REPAIR WITH MESH (Right Abdomen)  Patient Location: PACU  Anesthesia Type:General  Level of Consciousness: awake, alert  and oriented  Airway & Oxygen Therapy: Patient Spontanous Breathing and Patient connected to face mask oxygen  Post-op Assessment: Report given to RN and Post -op Vital signs reviewed and stable  Post vital signs: Reviewed and stable  Last Vitals:  Vitals Value Taken Time  BP 182/121 08/17/19 1217  Temp    Pulse 62 08/17/19 1218  Resp 14 08/17/19 1218  SpO2 100 % 08/17/19 1218  Vitals shown include unvalidated device data.  Last Pain:  Vitals:   08/17/19 1014  TempSrc: Oral      Patients Stated Pain Goal: 5 (93/26/71 2458)  Complications: No complications documented.

## 2019-08-17 NOTE — Anesthesia Procedure Notes (Signed)
Procedure Name: Intubation Date/Time: 08/17/2019 11:18 AM Performed by: Genelle Bal, CRNA Pre-anesthesia Checklist: Patient identified, Emergency Drugs available, Suction available and Patient being monitored Patient Re-evaluated:Patient Re-evaluated prior to induction Oxygen Delivery Method: Circle system utilized Preoxygenation: Pre-oxygenation with 100% oxygen Induction Type: IV induction Ventilation: Mask ventilation without difficulty Laryngoscope Size: Miller and 2 Grade View: Grade I Tube type: Oral Tube size: 7.5 mm Number of attempts: 1 Airway Equipment and Method: Stylet and Oral airway Placement Confirmation: ETT inserted through vocal cords under direct vision,  positive ETCO2 and breath sounds checked- equal and bilateral Secured at: 21 cm Tube secured with: Tape Dental Injury: Teeth and Oropharynx as per pre-operative assessment

## 2019-08-18 ENCOUNTER — Telehealth: Payer: Self-pay | Admitting: Surgery

## 2019-08-18 NOTE — Telephone Encounter (Signed)
Christopher Pennington  02-Nov-1985 [id number]  Patient Care Team: Patient, No Pcp Per as PCP - General (General Practice)  This patient is a 34 y.o.male who calls today for surgical evaluation.   Date of procedure/visit: 08/17/2019  Surgery: Laparoscopic right inguinal hernia repair with mesh by Dr. Ralene Ok 08/17/2019  Reason for call: Scrotal swelling.  Concerned mesh is dislodged.  Patient's wife called answering service around 6:00.  Answered back but there was no answer and no voicemail had been setup on his phone.  Patient wife called again answering service at 1150.  Answering service to able to reach him & connected me to them.    Patient with concerns.  Has moderate swelling down into the scrotum.  He notes he had a pretty large size right inguinal hernia and felt like it got larger between the week of consultation and surgery yesterday.  He feels like it is worse than before surgery.  He is very worried that the mesh is dislodged and has gone down to his scrotum and was researching online.  He does not have nausea or vomiting.  He is urinating and moving his bowels.  He denies much in the way of pain.  He wished to go over what happened during surgery and had questions about the technique of procedure.  I noted Dr. Rosendo Gros is not available at midnight but I was on call.  He does note that his wife did get discussions and recommendations by Dr. Rosendo Gros after surgery.  I read Dr. Ramirez's operative note.  I tried to reassure the patient that Dr. Rosendo Gros was able to reduce the hernia and use a larger preperitoneal mesh in place to repair the indirect inguinal hernia.  I noted it would be very unlikely for a large sheet of mesh to be herniating through the internal ring.  I noted is extremely common to get groin and scrotal swelling after hernia surgery and people can often get a fluid or hematoma pocket afterwards if it was a larger hernia that makes it seem like a hernia has come back.  I  ecommended ice pack and elevation to help control swelling and let it go down.  I tried to reassure him that usually swelling is markedly improved in a few weeks.  I recommended he call when the office is open if he has further concerns to help troubleshoot and have his interoperative questions answered by the surgeon, Dr. Rosendo Gros.  He seemed somewhat reassured but implied he wished to be seen in the office soon as possible to be reassured in person.  There are no problems to display for this patient.   Past Medical History:  Diagnosis Date  . History of kidney stones     No past surgical history on file.  Social History   Socioeconomic History  . Marital status: Married    Spouse name: Not on file  . Number of children: Not on file  . Years of education: Not on file  . Highest education level: Not on file  Occupational History  . Not on file  Tobacco Use  . Smoking status: Current Every Day Smoker    Packs/day: 0.50    Types: Cigarettes  . Smokeless tobacco: Never Used  Vaping Use  . Vaping Use: Never used  Substance and Sexual Activity  . Alcohol use: Not Currently  . Drug use: Not Currently  . Sexual activity: Not on file  Other Topics Concern  . Not on file  Social History  Narrative  . Not on file   Social Determinants of Health   Financial Resource Strain:   . Difficulty of Paying Living Expenses:   Food Insecurity:   . Worried About Charity fundraiser in the Last Year:   . Arboriculturist in the Last Year:   Transportation Needs:   . Film/video editor (Medical):   Marland Kitchen Lack of Transportation (Non-Medical):   Physical Activity:   . Days of Exercise per Week:   . Minutes of Exercise per Session:   Stress:   . Feeling of Stress :   Social Connections:   . Frequency of Communication with Friends and Family:   . Frequency of Social Gatherings with Friends and Family:   . Attends Religious Services:   . Active Member of Clubs or Organizations:   . Attends  Archivist Meetings:   Marland Kitchen Marital Status:   Intimate Partner Violence:   . Fear of Current or Ex-Partner:   . Emotionally Abused:   Marland Kitchen Physically Abused:   . Sexually Abused:     Family History  Family history unknown: Yes    Current Outpatient Medications  Medication Sig Dispense Refill  . traMADol (ULTRAM) 50 MG tablet Take 1 tablet (50 mg total) by mouth every 6 (six) hours as needed. 20 tablet 0   No current facility-administered medications for this visit.     No Known Allergies  @VS @  US SCROTUM W/DOPPLER  Result Date: 08/08/2019 CLINICAL DATA:  Right-sided testicular pain EXAM: SCROTAL ULTRASOUND DOPPLER ULTRASOUND OF THE TESTICLES TECHNIQUE: Complete ultrasound examination of the testicles, epididymis, and other scrotal structures was performed. Color and spectral Doppler ultrasound were also utilized to evaluate blood flow to the testicles. COMPARISON:  None. FINDINGS: Right testicle Measurements: 3.8 x 2.4 x 3.0 cm. No mass or microlithiasis visualized. Left testicle Measurements: 3.8 x 2.1 x 2.8 cm. No mass or microlithiasis visualized. Right epididymis:  5 mm cyst is noted within the right epididymis. Left epididymis:  Normal in size and appearance. Hydrocele:  None visualized. Varicocele:  None visualized. Pulsed Doppler interrogation of both testes demonstrates normal low resistance arterial and venous waveforms bilaterally. Incidental note is made of fat containing right inguinal hernia. IMPRESSION: Normal-appearing testicles.  No evidence of torsion. Fat containing right inguinal hernia. Electronically Signed   By: Inez Catalina M.D.   On: 08/08/2019 03:05    Note: This dictation was prepared with Dragon/digital dictation along with [employer]. Any transcriptional errors that result from this process are unintentional.   .Adin Hector, M.D., F.A.C.S. Gastrointestinal and Minimally Invasive Surgery [facility] [address] [place], [place] [address] [phone] Main / Paging  08/18/2019 12:01 AM

## 2019-08-19 ENCOUNTER — Encounter (HOSPITAL_COMMUNITY): Payer: Self-pay | Admitting: General Surgery

## 2023-03-26 ENCOUNTER — Emergency Department (HOSPITAL_COMMUNITY): Payer: Self-pay

## 2023-03-26 ENCOUNTER — Other Ambulatory Visit: Payer: Self-pay

## 2023-03-26 ENCOUNTER — Encounter (HOSPITAL_COMMUNITY): Payer: Self-pay | Admitting: Emergency Medicine

## 2023-03-26 ENCOUNTER — Emergency Department (HOSPITAL_COMMUNITY)
Admission: EM | Admit: 2023-03-26 | Discharge: 2023-03-26 | Disposition: A | Discharge status: Home / Self Care | Payer: Self-pay | Attending: Emergency Medicine | Admitting: Emergency Medicine

## 2023-03-26 ENCOUNTER — Telehealth: Payer: Self-pay | Admitting: Surgery

## 2023-03-26 DIAGNOSIS — S0031XA Abrasion of nose, initial encounter: Secondary | ICD-10-CM | POA: Diagnosis not present

## 2023-03-26 DIAGNOSIS — S2232XA Fracture of one rib, left side, initial encounter for closed fracture: Secondary | ICD-10-CM | POA: Diagnosis not present

## 2023-03-26 DIAGNOSIS — Y92410 Unspecified street and highway as the place of occurrence of the external cause: Secondary | ICD-10-CM | POA: Insufficient documentation

## 2023-03-26 DIAGNOSIS — S40812A Abrasion of left upper arm, initial encounter: Secondary | ICD-10-CM | POA: Insufficient documentation

## 2023-03-26 DIAGNOSIS — F10129 Alcohol abuse with intoxication, unspecified: Secondary | ICD-10-CM | POA: Insufficient documentation

## 2023-03-26 DIAGNOSIS — Y908 Blood alcohol level of 240 mg/100 ml or more: Secondary | ICD-10-CM | POA: Diagnosis not present

## 2023-03-26 DIAGNOSIS — M25512 Pain in left shoulder: Secondary | ICD-10-CM | POA: Diagnosis not present

## 2023-03-26 DIAGNOSIS — F17200 Nicotine dependence, unspecified, uncomplicated: Secondary | ICD-10-CM | POA: Diagnosis not present

## 2023-03-26 DIAGNOSIS — E278 Other specified disorders of adrenal gland: Secondary | ICD-10-CM

## 2023-03-26 DIAGNOSIS — R930 Abnormal findings on diagnostic imaging of skull and head, not elsewhere classified: Secondary | ICD-10-CM | POA: Insufficient documentation

## 2023-03-26 DIAGNOSIS — F10929 Alcohol use, unspecified with intoxication, unspecified: Secondary | ICD-10-CM

## 2023-03-26 DIAGNOSIS — S299XXA Unspecified injury of thorax, initial encounter: Secondary | ICD-10-CM | POA: Diagnosis present

## 2023-03-26 DIAGNOSIS — S30811A Abrasion of abdominal wall, initial encounter: Secondary | ICD-10-CM | POA: Diagnosis not present

## 2023-03-26 DIAGNOSIS — D3502 Benign neoplasm of left adrenal gland: Secondary | ICD-10-CM | POA: Insufficient documentation

## 2023-03-26 DIAGNOSIS — T07XXXA Unspecified multiple injuries, initial encounter: Secondary | ICD-10-CM

## 2023-03-26 LAB — PROTIME-INR
INR: 1 (ref 0.8–1.2)
Prothrombin Time: 13.8 s (ref 11.4–15.2)

## 2023-03-26 LAB — CBC
HCT: 42.5 % (ref 39.0–52.0)
Hemoglobin: 14.8 g/dL (ref 13.0–17.0)
MCH: 30.3 pg (ref 26.0–34.0)
MCHC: 34.8 g/dL (ref 30.0–36.0)
MCV: 86.9 fL (ref 80.0–100.0)
Platelets: 161 10*3/uL (ref 150–400)
RBC: 4.89 MIL/uL (ref 4.22–5.81)
RDW: 13.4 % (ref 11.5–15.5)
WBC: 8.8 10*3/uL (ref 4.0–10.5)
nRBC: 0 % (ref 0.0–0.2)

## 2023-03-26 LAB — URINALYSIS, ROUTINE W REFLEX MICROSCOPIC
Bilirubin Urine: NEGATIVE
Glucose, UA: NEGATIVE mg/dL
Hgb urine dipstick: NEGATIVE
Ketones, ur: NEGATIVE mg/dL
Leukocytes,Ua: NEGATIVE
Nitrite: NEGATIVE
Protein, ur: NEGATIVE mg/dL
Specific Gravity, Urine: 1.021 (ref 1.005–1.030)
pH: 5 (ref 5.0–8.0)

## 2023-03-26 LAB — I-STAT CG4 LACTIC ACID, ED: Lactic Acid, Venous: 2.9 mmol/L (ref 0.5–1.9)

## 2023-03-26 LAB — I-STAT CHEM 8, ED
BUN: 15 mg/dL (ref 6–20)
Calcium, Ion: 1.13 mmol/L — ABNORMAL LOW (ref 1.15–1.40)
Chloride: 103 mmol/L (ref 98–111)
Creatinine, Ser: 1.2 mg/dL (ref 0.61–1.24)
Glucose, Bld: 105 mg/dL — ABNORMAL HIGH (ref 70–99)
HCT: 45 % (ref 39.0–52.0)
Hemoglobin: 15.3 g/dL (ref 13.0–17.0)
Potassium: 3.4 mmol/L — ABNORMAL LOW (ref 3.5–5.1)
Sodium: 139 mmol/L (ref 135–145)
TCO2: 24 mmol/L (ref 22–32)

## 2023-03-26 LAB — COMPREHENSIVE METABOLIC PANEL
ALT: 31 U/L (ref 0–44)
AST: 27 U/L (ref 15–41)
Albumin: 3.9 g/dL (ref 3.5–5.0)
Alkaline Phosphatase: 80 U/L (ref 38–126)
Anion gap: 12 (ref 5–15)
BUN: 14 mg/dL (ref 6–20)
CO2: 24 mmol/L (ref 22–32)
Calcium: 8.8 mg/dL — ABNORMAL LOW (ref 8.9–10.3)
Chloride: 101 mmol/L (ref 98–111)
Creatinine, Ser: 0.88 mg/dL (ref 0.61–1.24)
GFR, Estimated: >60 mL/min (ref >60)
Glucose, Bld: 109 mg/dL — ABNORMAL HIGH (ref 70–99)
Potassium: 3.4 mmol/L — ABNORMAL LOW (ref 3.5–5.1)
Sodium: 137 mmol/L (ref 135–145)
Total Bilirubin: 0.4 mg/dL (ref 0.0–1.2)
Total Protein: 6.7 g/dL (ref 6.5–8.1)

## 2023-03-26 LAB — SAMPLE TO BLOOD BANK

## 2023-03-26 LAB — ETHANOL: Alcohol, Ethyl (B): 254 mg/dL — ABNORMAL HIGH (ref <10)

## 2023-03-26 MED ORDER — FENTANYL CITRATE PF 50 MCG/ML IJ SOSY
PREFILLED_SYRINGE | INTRAMUSCULAR | Status: AC
  Filled 2023-03-26: qty 1

## 2023-03-26 MED ORDER — IBUPROFEN 800 MG PO TABS: 800.0000 mg | ORAL_TABLET | Freq: Three times a day (TID) | ORAL | 0 refills | Status: AC | PRN

## 2023-03-26 MED ORDER — FENTANYL CITRATE PF 50 MCG/ML IJ SOSY
50.0000 ug | PREFILLED_SYRINGE | Freq: Once | INTRAMUSCULAR | Status: AC
  Administered 2023-03-26: 50 ug via INTRAVENOUS

## 2023-03-26 MED ORDER — METHOCARBAMOL 500 MG PO TABS: 500.0000 mg | ORAL_TABLET | Freq: Three times a day (TID) | ORAL | 0 refills | Status: AC | PRN

## 2023-03-26 MED ORDER — MORPHINE SULFATE (PF) 4 MG/ML IV SOLN
4.0000 mg | Freq: Once | INTRAVENOUS | Status: AC
  Administered 2023-03-26: 4 mg via INTRAVENOUS
  Filled 2023-03-26: qty 1

## 2023-03-26 MED ORDER — IOHEXOL 350 MG/ML SOLN
75.0000 mL | Freq: Once | INTRAVENOUS | Status: AC | PRN
  Administered 2023-03-26: 75 mL via INTRAVENOUS

## 2023-03-26 NOTE — ED Notes (Signed)
 This nurse educated pt on the use of an incentive spirometer. He was accompanied by his GF when he was discharged.

## 2023-03-26 NOTE — ED Notes (Signed)
Patient transported to CT with TRN Emilie
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
## 2023-03-26 NOTE — ED Notes (Signed)
 Went to assess patient and he was standing at the end of the bed naked. He told me to leave so he could use the bathroom. This nurse advised I could assist so he does not fall but he insisted I leave. When I returned he was standing in his room crying and when this nurse tried to help him to have a seat he refused. This nurse attempted to place patient back in bed and back on monitor but he continued to refuse. He did have a seat but did not want to be placed back on monitor.

## 2023-03-26 NOTE — ED Notes (Signed)
C-collar applied upon arrival to ED.

## 2023-03-26 NOTE — Progress Notes (Signed)
 Orthopedic Tech Progress Note Patient Details:  Christopher Pennington Jan 01, 1986 [id number]  Patient ID: [name], male   DOB: 1985-09-24, 38 y.o.   MRN: [id number] I attended trauma page. Trinna Post 03/26/2023, 2:51 AM

## 2023-03-26 NOTE — Progress Notes (Signed)
 Transition of Care [facility]) - CAGE-AID Screening   Patient Details  Name: Christopher Pennington MRN: [id number] Date of Birth: 15-Oct-1985  [facility] CM/SW Contact:    Katha Hamming, RN Phone Number: 03/26/2023, 1:24 AM   Clinical Narrative:  Pt refusing to answer questions related to substance abuse at this time. ETOH on board at time of admission.  CAGE-AID Screening: Substance Abuse Screening unable to be completed due to: : Patient Refused
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
## 2023-03-26 NOTE — Progress Notes (Signed)
 Orthopedic Tech Progress Note Patient Details:  Christopher Pennington [birth year] [id number]  Ortho Devices Type of Ortho Device: Arm sling Ortho Device/Splint Location: lue Ortho Device/Splint Interventions: Ordered, Application, Adjustment   Post Interventions Patient Tolerated: Well Instructions Provided: Care of device, Adjustment of device  Trinna Post 03/26/2023, 4:21 AM

## 2023-03-26 NOTE — ED Notes (Signed)
Pt refused vitals upon departure
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
## 2023-03-26 NOTE — Telephone Encounter (Signed)
 Patient called concerning medication
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
## 2023-04-23 ENCOUNTER — Other Ambulatory Visit: Payer: Self-pay

## 2023-04-23 ENCOUNTER — Emergency Department (HOSPITAL_COMMUNITY)

## 2023-04-23 ENCOUNTER — Emergency Department (HOSPITAL_COMMUNITY)
Admission: EM | Admit: 2023-04-23 | Discharge: 2023-04-24 | Disposition: A | Discharge status: Home / Self Care | Attending: Emergency Medicine | Admitting: Emergency Medicine

## 2023-04-23 DIAGNOSIS — Z87891 Personal history of nicotine dependence: Secondary | ICD-10-CM | POA: Diagnosis not present

## 2023-04-23 DIAGNOSIS — R000 Tachycardia, unspecified: Secondary | ICD-10-CM | POA: Diagnosis not present

## 2023-04-23 DIAGNOSIS — J36 Peritonsillar abscess: Secondary | ICD-10-CM

## 2023-04-23 DIAGNOSIS — R509 Fever, unspecified: Secondary | ICD-10-CM | POA: Insufficient documentation

## 2023-04-23 DIAGNOSIS — J029 Acute pharyngitis, unspecified: Secondary | ICD-10-CM | POA: Diagnosis present

## 2023-04-23 LAB — RESP PANEL BY RT-PCR (RSV, FLU A&B, COVID)  RVPGX2
Influenza A by PCR: NEGATIVE
Influenza B by PCR: NEGATIVE
Resp Syncytial Virus by PCR: NEGATIVE
SARS Coronavirus 2 by RT PCR: NEGATIVE

## 2023-04-23 LAB — COMPREHENSIVE METABOLIC PANEL WITH GFR
ALT: 62 U/L — ABNORMAL HIGH (ref 0–44)
AST: 34 U/L (ref 15–41)
Albumin: 4.5 g/dL (ref 3.5–5.0)
Alkaline Phosphatase: 121 U/L (ref 38–126)
Anion gap: 9 (ref 5–15)
BUN: 13 mg/dL (ref 6–20)
CO2: 27 mmol/L (ref 22–32)
Calcium: 9.6 mg/dL (ref 8.9–10.3)
Chloride: 99 mmol/L (ref 98–111)
Creatinine, Ser: 0.94 mg/dL (ref 0.61–1.24)
GFR, Estimated: >60 mL/min (ref >60)
Glucose, Bld: 93 mg/dL (ref 70–99)
Potassium: 3.7 mmol/L (ref 3.5–5.1)
Sodium: 135 mmol/L (ref 135–145)
Total Bilirubin: 0.9 mg/dL (ref 0.0–1.2)
Total Protein: 7.8 g/dL (ref 6.5–8.1)

## 2023-04-23 LAB — CBC WITH DIFFERENTIAL/PLATELET
Abs Immature Granulocytes: 0.04 10*3/uL (ref 0.00–0.07)
Basophils Absolute: 0 10*3/uL (ref 0.0–0.1)
Basophils Relative: 0 %
Eosinophils Absolute: 0 10*3/uL (ref 0.0–0.5)
Eosinophils Relative: 0 %
HCT: 43.7 % (ref 39.0–52.0)
Hemoglobin: 14.8 g/dL (ref 13.0–17.0)
Immature Granulocytes: 0 %
Lymphocytes Relative: 13 %
Lymphs Abs: 1.6 10*3/uL (ref 0.7–4.0)
MCH: 29.7 pg (ref 26.0–34.0)
MCHC: 33.9 g/dL (ref 30.0–36.0)
MCV: 87.6 fL (ref 80.0–100.0)
Monocytes Absolute: 1 10*3/uL (ref 0.1–1.0)
Monocytes Relative: 8 %
Neutro Abs: 9.8 10*3/uL — ABNORMAL HIGH (ref 1.7–7.7)
Neutrophils Relative %: 79 %
Platelets: 249 10*3/uL (ref 150–400)
RBC: 4.99 MIL/uL (ref 4.22–5.81)
RDW: 12.6 % (ref 11.5–15.5)
WBC: 12.4 10*3/uL — ABNORMAL HIGH (ref 4.0–10.5)
nRBC: 0 % (ref 0.0–0.2)

## 2023-04-23 LAB — GROUP A STREP BY PCR: Group A Strep by PCR: NOT DETECTED

## 2023-04-23 LAB — LIPASE, BLOOD: Lipase: 26 U/L (ref 11–51)

## 2023-04-23 LAB — ETHANOL: Alcohol, Ethyl (B): <10 mg/dL (ref <10)

## 2023-04-23 LAB — I-STAT CG4 LACTIC ACID, ED: Lactic Acid, Venous: 1 mmol/L (ref 0.5–1.9)

## 2023-04-23 MED ORDER — ACETAMINOPHEN 325 MG PO TABS
650.0000 mg | ORAL_TABLET | Freq: Once | ORAL | Status: AC | PRN
  Administered 2023-04-23: 650 mg via ORAL
  Filled 2023-04-23: qty 2

## 2023-04-23 MED ORDER — SODIUM CHLORIDE 0.9 % IV BOLUS
1000.0000 mL | Freq: Once | INTRAVENOUS | Status: AC
  Administered 2023-04-23: 1000 mL via INTRAVENOUS

## 2023-04-23 MED ORDER — IOHEXOL 300 MG/ML  SOLN
75.0000 mL | Freq: Once | INTRAMUSCULAR | Status: AC | PRN
  Administered 2023-04-23: 75 mL via INTRAVENOUS

## 2023-04-23 MED ORDER — SODIUM CHLORIDE 0.9 % IV SOLN: INTRAVENOUS | Status: DC

## 2023-04-23 MED ORDER — HYDROCODONE-ACETAMINOPHEN 5-325 MG PO TABS: 1.0000 | ORAL_TABLET | Freq: Four times a day (QID) | ORAL | 0 refills | Status: DC | PRN

## 2023-04-23 MED ORDER — CLINDAMYCIN HCL 300 MG PO CAPS: 300.0000 mg | ORAL_CAPSULE | Freq: Three times a day (TID) | ORAL | 0 refills | Status: AC

## 2023-04-23 MED ORDER — CLINDAMYCIN PHOSPHATE 300 MG/50ML IV SOLN
300.0000 mg | Freq: Once | INTRAVENOUS | Status: AC
  Administered 2023-04-23: 300 mg via INTRAVENOUS
  Filled 2023-04-23: qty 50

## 2023-04-23 NOTE — Discharge Instructions (Addendum)
 Take the antibiotic clindamycin as directed.  First dose given to you here tonight.  Follow-up with ear nose and throat.  Call in the morning information provided above.  Take the hydrocodone as needed for pain.
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
## 2023-04-23 NOTE — ED Triage Notes (Signed)
 Pt BIBA from home. C/o sore throat for 3x days. Feels like they have a lump in their throat

## 2023-04-24 ENCOUNTER — Other Ambulatory Visit (INDEPENDENT_AMBULATORY_CARE_PROVIDER_SITE_OTHER): Payer: Self-pay | Admitting: Physician Assistant

## 2023-04-24 ENCOUNTER — Ambulatory Visit (INDEPENDENT_AMBULATORY_CARE_PROVIDER_SITE_OTHER): Admitting: Otolaryngology

## 2023-04-24 ENCOUNTER — Encounter (INDEPENDENT_AMBULATORY_CARE_PROVIDER_SITE_OTHER): Payer: Self-pay | Admitting: Otolaryngology

## 2023-04-24 VITALS — BP 138/93 | HR 94

## 2023-04-24 DIAGNOSIS — J36 Peritonsillar abscess: Secondary | ICD-10-CM | POA: Diagnosis not present

## 2023-04-24 LAB — I-STAT CG4 LACTIC ACID, ED: Lactic Acid, Venous: 0.7 mmol/L (ref 0.5–1.9)

## 2023-04-24 MED ORDER — HYDROCODONE-ACETAMINOPHEN 5-325 MG PO TABS
1.0000 | ORAL_TABLET | ORAL | Status: AC | PRN
Start: 2023-04-24 — End: ?

## 2023-04-24 MED ORDER — HYDROCODONE-ACETAMINOPHEN 5-325 MG PO TABS: 1.0000 | ORAL_TABLET | ORAL | 0 refills | Status: AC | PRN

## 2023-04-24 NOTE — Progress Notes (Signed)
 Christopher Pennington is an 38 y.o. male.   Chief Complaint: throat infection HPI: hxof 3 days of right sided throat pain. He has not had tonsillitis previously. He was seen last night with PTA and sent to the office. He is slghtly better. He has no airway problems. He has CT scan with small collection very low in the right tonsil. Airway patent. He received clindamycin in ER and steroids.  Past Medical History:  Diagnosis Date   History of kidney stones     Past Surgical History:  Procedure Laterality Date   INGUINAL HERNIA REPAIR Right 08/17/2019   Procedure: LAPAROSCOPIC RIGHT INGUINAL HERNIA REPAIR WITH MESH;  Surgeon: Axel Filler, MD;  Location: [facility]S;  Service: General;  Laterality: Right;    Family History  Family history unknown: Yes   Social History:  reports that he has been smoking cigarettes. He has never used smokeless tobacco. He reports that he does not currently use alcohol. He reports that he does not currently use drugs.  Allergies: No Known Allergies  (Not in a hospital admission)   Results for orders placed or performed during the hospital encounter of 04/23/23 (from the past 48 hours)  Group A Strep by PCR     Status: None   Collection Time: 04/23/23  6:32 PM   Specimen: Throat; Sterile Swab  Result Value Ref Range   Group A Strep by PCR NOT DETECTED NOT DETECTED    Comment: Performed at [facility], [address] [place], [place] [address]  Culture, blood (Routine X 2) w Reflex to ID Panel     Status: None (Preliminary result)   Collection Time: 04/23/23  8:30 PM   Specimen: BLOOD  Result Value Ref Range   Specimen Description      BLOOD RIGHT ANTECUBITAL Performed at [facility], [address] [place], [place] [address]    Special Requests      BOTTLES DRAWN AEROBIC AND ANAEROBIC Blood Culture results may not be optimal due to an inadequate volume of blood received in culture bottles Performed at [facility], [address], [place], [place] [address]    Culture      NO GROWTH < 12 HOURS Performed at [facility], [address], [place], [place] [address]    Report Status PENDING   CBC with Differential/Platelet     Status: Abnormal   Collection Time: 04/23/23  8:30 PM  Result Value Ref Range   WBC 12.4 (H) 4.0 - 10.5 K/uL   RBC 4.99 4.22 - 5.81 MIL/uL   Hemoglobin 14.8 13.0 - 17.0 g/dL   HCT 30.8 65.7 - 84.6 %   MCV 87.6 80.0 - 100.0 fL   MCH 29.7 26.0 - 34.0 pg   MCHC 33.9 30.0 - 36.0 g/dL   RDW 96.2 95.2 - 84.1 %   Platelets 249 150 - 400 K/uL   nRBC 0.0 0.0 - 0.2 %   Neutrophils Relative % 79 %   Neutro Abs 9.8 (H) 1.7 - 7.7 K/uL   Lymphocytes Relative 13 %   Lymphs Abs 1.6 0.7 - 4.0 K/uL   Monocytes Relative 8 %   Monocytes Absolute 1.0 0.1 - 1.0 K/uL   Eosinophils Relative 0 %   Eosinophils Absolute 0.0 0.0 - 0.5 K/uL   Basophils Relative 0 %   Basophils Absolute 0.0 0.0 - 0.1 K/uL   Immature Granulocytes 0 %   Abs Immature Granulocytes 0.04 0.00 -  0.07 K/uL    Comment: Performed at [facility], [address] [place], [place] [address]  Comprehensive metabolic panel with GFR     Status: Abnormal   Collection Time: 04/23/23  8:30 PM  Result Value Ref Range   Sodium 135 135 - 145 mmol/L   Potassium 3.7 3.5 - 5.1 mmol/L   Chloride 99 98 - 111 mmol/L   CO2 27 22 - 32 mmol/L   Glucose, Bld 93 70 - 99 mg/dL    Comment: Glucose reference range applies only to samples taken after fasting for at least 8 hours.   BUN 13 6 - 20 mg/dL   Creatinine, Ser 6.04 0.61 - 1.24 mg/dL   Calcium 9.6 8.9 - 54.0 mg/dL   Total Protein 7.8 6.5 - 8.1 g/dL   Albumin 4.5 3.5 - 5.0 g/dL   AST 34 15 - 41 U/L   ALT 62 (H) 0 - 44 U/L   Alkaline Phosphatase 121 38 - 126 U/L   Total Bilirubin 0.9 0.0 - 1.2 mg/dL   GFR, Estimated >98 >11 mL/min    Comment: (NOTE) Calculated using the CKD-EPI Creatinine Equation (2021)    Anion gap 9 5 - 15     Comment: Performed at [facility], [address], [place], [place] [address]  Lipase, blood     Status: None   Collection Time: 04/23/23  8:30 PM  Result Value Ref Range   Lipase 26 11 - 51 U/L    Comment: Performed at [facility], [address], [place], [place] [address]  Ethanol     Status: None   Collection Time: 04/23/23  8:30 PM  Result Value Ref Range   Alcohol, Ethyl (B) <10 <10 mg/dL    Comment: (NOTE) Lowest detectable limit for serum alcohol is 10 mg/dL.  For medical purposes only. Performed at [facility], [address] [place], [place] [address]   Resp panel by RT-PCR (RSV, Flu A&B, Covid)     Status: None   Collection Time: 04/23/23  8:30 PM   Specimen: Nasal Swab  Result Value Ref Range   SARS Coronavirus 2 by RT PCR NEGATIVE NEGATIVE    Comment: (NOTE) SARS-CoV-2 target nucleic acids are NOT DETECTED.  The SARS-CoV-2 RNA is generally detectable in upper respiratory specimens during the acute phase of infection. The lowest concentration of SARS-CoV-2 viral copies this assay can detect is 138 copies/mL. A negative result does not preclude SARS-Cov-2 infection and should not be used as the sole basis for treatment or other patient management decisions. A negative result may occur with  improper specimen collection/handling, submission of specimen other than nasopharyngeal swab, presence of viral mutation(s) within the areas targeted by this assay, and inadequate number of viral copies(<138 copies/mL). A negative result must be combined with clinical observations, patient history, and epidemiological information. The expected result is Negative.  Fact Sheet for Patients:  BloggerCourse.com  Fact Sheet for Healthcare Providers:  SeriousBroker.it  This test is no t yet approved or cleared by the [place] FDA and  has been authorized for detection  and/or diagnosis of SARS-CoV-2 by FDA under an Emergency Use Authorization (EUA). This EUA will remain  in effect (meaning this test can be used) for the duration of the COVID-19 declaration under Section 564(b)(1) of the Act, 21 U.S.C.section 360bbb-3(b)(1), unless the authorization is terminated  or revoked sooner.       Influenza A by PCR NEGATIVE NEGATIVE  Influenza B by PCR NEGATIVE NEGATIVE    Comment: (NOTE) The Xpert Xpress SARS-CoV-2/FLU/RSV plus assay is intended as an aid in the diagnosis of influenza from Nasopharyngeal swab specimens and should not be used as a sole basis for treatment. Nasal washings and aspirates are unacceptable for Xpert Xpress SARS-CoV-2/FLU/RSV testing.  Fact Sheet for Patients: BloggerCourse.com  Fact Sheet for Healthcare Providers: SeriousBroker.it  This test is not yet approved or cleared by the [place] FDA and has been authorized for detection and/or diagnosis of SARS-CoV-2 by FDA under an Emergency Use Authorization (EUA). This EUA will remain in effect (meaning this test can be used) for the duration of the COVID-19 declaration under Section 564(b)(1) of the Act, 21 U.S.C. section 360bbb-3(b)(1), unless the authorization is terminated or revoked.     Resp Syncytial Virus by PCR NEGATIVE NEGATIVE    Comment: (NOTE) Fact Sheet for Patients: BloggerCourse.com  Fact Sheet for Healthcare Providers: SeriousBroker.it  This test is not yet approved or cleared by the [place] FDA and has been authorized for detection and/or diagnosis of SARS-CoV-2 by FDA under an Emergency Use Authorization (EUA). This EUA will remain in effect (meaning this test can be used) for the duration of the COVID-19 declaration under Section 564(b)(1) of the Act, 21 U.S.C. section 360bbb-3(b)(1), unless the authorization is terminated  or revoked.  Performed at [facility], [address] [place], [place] [address]   Culture, blood (Routine X 2) w Reflex to ID Panel     Status: None (Preliminary result)   Collection Time: 04/23/23  8:35 PM   Specimen: BLOOD  Result Value Ref Range   Specimen Description      BLOOD BLOOD RIGHT ARM Performed at [facility], [address], [place], [place] [address]    Special Requests      BOTTLES DRAWN AEROBIC AND ANAEROBIC Blood Culture results may not be optimal due to an inadequate volume of blood received in culture bottles Performed at [facility], [address] [place], [place] [address]    Culture      NO GROWTH < 12 HOURS Performed at [facility], [address] [place], [place] [address]    Report Status PENDING   I-Stat Lactic Acid     Status: None   Collection Time: 04/23/23  8:47 PM  Result Value Ref Range   Lactic Acid, Venous 1.0 0.5 - 1.9 mmol/L  I-Stat Lactic Acid     Status: None   Collection Time: 04/23/23 11:59 PM  Result Value Ref Range   Lactic Acid, Venous 0.7 0.5 - 1.9 mmol/L   CT Soft Tissue Neck W Contrast Result Date: 04/23/2023 CLINICAL DATA:  Initial evaluation for acute sore throat. EXAM: CT NECK WITH CONTRAST TECHNIQUE: Multidetector CT imaging of the neck was performed using the standard protocol following the bolus administration of intravenous contrast. RADIATION DOSE REDUCTION: This exam was performed according to the departmental dose-optimization program which includes automated exposure control, adjustment of the mA and/or kV according to patient size and/or use of iterative reconstruction technique. CONTRAST:  75mL OMNIPAQUE IOHEXOL 300 MG/ML  SOLN COMPARISON:  None Available. FINDINGS: Pharynx and larynx: Oral cavity within normal limits. Palatine tonsils are prominent and hypertrophied, suggesting acute tonsillitis, changes are slightly more pronounced on the right.  Superimposed hypodense collection measuring 2.0 x 0.9 x 1.2 cm seen at the inferior margin of the right tonsil, extending towards the right vallecula, consistent with a small  peritonsillar abscess (series 3, image 52). Associated mild edema within the adjacent oropharyngeal mucosa. Nasopharynx within normal limits. No retropharyngeal collection. Epiglottis itself within normal limits. Hypopharynx and supraglottic larynx within normal limits. Negative [place]. Subglottic airway patent clear. Salivary glands: Salivary glands including the parotid and submandibular glands are within normal limits. Thyroid: Normal. Lymph nodes: Prominent right upper cervical lymph nodes, largest of which measures 1.7 cm in short axis at level 2, presumably reactive. Vascular: Normal intravascular enhancement seen within the neck. Limited intracranial: Unremarkable. Visualized orbits: Unremarkable. Mastoids and visualized paranasal sinuses: Small left maxillary sinus retention cyst. Visualized paranasal sinuses are otherwise clear. Visualized mastoids and middle ear cavities are well pneumatized and free of fluid. Skeleton: No discrete or worrisome osseous lesions. Upper chest: No other acute finding. Other: None. IMPRESSION: 1. Findings consistent with acute tonsillitis with superimposed 2.0 x 0.9 x 1.2 cm right peritonsillar abscess as above. 2. Prominent right upper cervical lymph nodes, presumably reactive. Electronically Signed   By: Rise Mu M.D.   On: 04/23/2023 22:30   DG Chest Port 1 View Result Date: 04/23/2023 CLINICAL DATA:  Fever. EXAM: PORTABLE CHEST 1 VIEW COMPARISON:  Radiograph and CT 03/26/2023 FINDINGS: The cardiomediastinal contours are normal. The lungs are clear. Pulmonary vasculature is normal. No consolidation, pleural effusion, or pneumothorax. No acute osseous abnormalities are seen. Probable artifact overlying the left upper abdomen. IMPRESSION: No acute chest findings. Electronically Signed   By:  Narda Rutherford M.D.   On: 04/23/2023 21:53     Blood pressure (!) 138/93, pulse 94, SpO2 98%.  PHYSICAL EXAM: Appearance _ awake alert with no distress.  Head- atraumatic and no obvious abnormalities Eyes- PERRL, EOMI, no conjunctiva injection or ecchymosis Ears-  Right- Pinna without inflammation or swelling. canal without obstruction or injury. TM within normal limits  Left- Pinna without inflammation or swelling. canal without obstruction or injury. TM within normal limits Nose- no lesions or masses.  Oc/OP- opens mouth well. The right tonsil does not have significant bulging or erythema. The peritonsillar area is soft. No stridor or breathing issuesno lesions or excessive swelling. Mouth opening normal.  Hp/Larynx- normal voice and no airway issues. No swelling or lesions Neck- no mass or lesions. Normal movement.  Neuro- CNII-XII intact, no sensory deficits.  Lungs- normal effort no distress noted      Assessment/Plan Right PTA- this is low and not appropriate to attempt drainage in the office especially given the small area of hypodensity.  He is slightly better. He has no airway issues. We discussed options and he will continue the clindamycin and pain meds. He will go back to hospital if he is not better by tomorrow or sooner if worse. He will go to [facility].   Suzanna Obey 04/24/2023, 11:07 AM

## 2023-04-27 ENCOUNTER — Telehealth (INDEPENDENT_AMBULATORY_CARE_PROVIDER_SITE_OTHER): Payer: Self-pay

## 2023-04-27 NOTE — Telephone Encounter (Signed)
 Left a message for the patient to see if he was able to get his meds that were called in on Friday by Dr Virgia Griffins

## 2023-04-28 LAB — CULTURE, BLOOD (ROUTINE X 2)
Culture: NO GROWTH
Culture: NO GROWTH

## 2023-10-31 ENCOUNTER — Emergency Department (HOSPITAL_BASED_OUTPATIENT_CLINIC_OR_DEPARTMENT_OTHER)
Admission: EM | Admit: 2023-10-31 | Discharge: 2023-10-31 | Disposition: A | Discharge status: Home / Self Care | Attending: Emergency Medicine | Admitting: Emergency Medicine

## 2023-10-31 DIAGNOSIS — L089 Local infection of the skin and subcutaneous tissue, unspecified: Secondary | ICD-10-CM

## 2023-10-31 DIAGNOSIS — L723 Sebaceous cyst: Secondary | ICD-10-CM | POA: Diagnosis not present

## 2023-10-31 DIAGNOSIS — R221 Localized swelling, mass and lump, neck: Secondary | ICD-10-CM | POA: Diagnosis present

## 2023-10-31 MED ORDER — LIDOCAINE-EPINEPHRINE (PF) 2 %-1:200000 IJ SOLN
10.0000 mL | Freq: Once | INTRAMUSCULAR | Status: AC
  Administered 2023-10-31: 10 mL
  Filled 2023-10-31: qty 20

## 2023-10-31 NOTE — ED Provider Notes (Signed)
 [facility] AT [facility] Provider Note   CSN: [id number] Arrival date & time: 10/31/23  1204     Patient presents with: Abscess   Christopher Pennington is a 38 y.o. male who presents emergency department with a chief complaint of painful swelling on the posterior left neck.  He reports that he is had a bump there for a very long time but recently he noticed that it was more painful and has progressively worsened to the point where it hurts to turn his neck or apply any pressure to that area.  He has a history of cystic acne.  He denies any fever or chills.    Abscess      Prior to Admission medications   Medication Sig Start Date End Date Taking? Authorizing Provider  HYDROcodone -acetaminophen  (NORCO/VICODIN) 5-325 MG tablet Take 1 tablet by mouth every 4 (four) hours as needed for moderate pain (pain score 4-6). 04/24/23   Hedges, Reyes, PA-C  ibuprofen  (ADVIL ) 800 MG tablet Take 1 tablet (800 mg total) by mouth every 8 (eight) hours as needed for moderate pain (pain score 4-6). 03/26/23   Long, Fonda MATSU, MD  methocarbamol  (ROBAXIN ) 500 MG tablet Take 1 tablet (500 mg total) by mouth every 8 (eight) hours as needed. 03/26/23   Long, Joshua G, MD    Allergies: Patient has no known allergies.    Review of Systems  Updated Vital Signs BP (!) 150/92 (BP Location: Right Arm)   Pulse 86   Temp 98.5 F (36.9 C)   Resp 17   SpO2 98%   Physical Exam Vitals and nursing note reviewed.  Constitutional:      General: He is not in acute distress.    Appearance: He is well-developed. He is not diaphoretic.  HENT:     Head: Normocephalic and atraumatic.  Eyes:     General: No scleral icterus.    Conjunctiva/sclera: Conjunctivae normal.  Cardiovascular:     Rate and Rhythm: Normal rate and regular rhythm.     Heart sounds: Normal heart sounds.  Pulmonary:     Effort: Pulmonary effort is normal. No respiratory distress.     Breath sounds: Normal breath  sounds.  Abdominal:     Palpations: Abdomen is soft.     Tenderness: There is no abdominal tenderness.  Musculoskeletal:     Cervical back: Normal range of motion and neck supple.  Skin:    General: Skin is warm and dry.     Comments: Well-circumscribed erythematous nodule in the posterior neck consistent with inflamed or infected sebaceous cyst there is obvious purulent material just under the surface is a thick of the skin with inferior pustular head.  No surrounding induration or lymphangitis suggestive of cellulitis.  Neurological:     Mental Status: He is alert.  Psychiatric:        Behavior: Behavior normal.    (all labs ordered are listed, but only abnormal results are displayed) Labs Reviewed - No data to display  EKG: None  Radiology: No results found.   .Incision and Drainage  Date/Time: 10/31/2023 3:41 PM  Performed by: Arloa Chroman, PA-C Authorized by: Arloa Chroman, PA-C   Consent:    Consent obtained:  Verbal   Consent given by:  Patient   Risks discussed:  Bleeding, incomplete drainage, pain and infection   Alternatives discussed:  No treatment Universal protocol:    Patient identity confirmed:  Arm band Location:    Type:  Cyst  Size:  4 cm   Location:  Neck   Neck location:  L posterior Pre-procedure details:    Skin preparation:  Chlorhexidine  Anesthesia:    Anesthesia method:  Local infiltration   Local anesthetic:  Lidocaine  1% WITH epi Procedure type:    Complexity:  Simple Procedure details:    Incision types:  Single straight   Incision depth:  Dermal   Wound management:  Probed and deloculated and irrigated with saline   Drainage characteristics: purulent and sebaceous.   Wound treatment:  Wound left open   Packing materials:  None Post-procedure details:    Procedure completion:  Tolerated well, no immediate complications    Medications Ordered in the ED  lidocaine -EPINEPHrine  (XYLOCAINE  W/EPI) 2 %-1:200000 (PF) injection 10  mL (10 mLs Infiltration Given by Other 10/31/23 1325)                                    Medical Decision Making Risk Prescription drug management.    Patient here for complaint of painful nodule in the back of the neck, differential diagnosis includes abscess, infected sebaceous cyst, lymphadenitis.  After incision and drainage patient clearly had inflamed/infected epidermoid cyst.  He has a very adherent cyst wall sac and will need excision with surgery for definitive management.  His infection was taken care of today with irrigation and the loculation probing removal as much of the sebaceous debris and purulent debris as possible, wound left open, will discharge on doxycycline, alternating Tylenol  and ibuprofen  for pain control ice and strict return precautions.     Final diagnoses:  Infected sebaceous cyst of skin    ED Discharge Orders     None          Arloa Chroman, PA-C 10/31/23 1544    Jerrol Agent, MD 10/31/23 1553

## 2023-10-31 NOTE — ED Triage Notes (Signed)
 Pt reports painful hard lump on left upper neck that has recently grown in size.  Pt states h/o smaller lump at site for a long long time.  Pt denies nausea or fever at home.

## 2023-10-31 NOTE — Discharge Instructions (Addendum)
 Contact a health care provider if: Your cyst or abscess comes back. You have any signs of infection. You notice red streaks that spread away from the incision site. You have a fever or chills. Get help right away if: You have severe pain or bleeding. You become short of breath. You have chest pain. You have signs of a severe infection. You may notice changes in your incision area, such as: Swelling that makes the skin feel hard. Numbness or tingling. Sudden increase in redness. Your skin color may change from red to purple, and then to dark spots. Blisters, ulcers, or splitting of the skin. These symptoms may be an emergency. Get help right away. Call 911. Do not wait to see if the symptoms will go away. Do not drive yourself to the hospital. This information is not intended to replace advice given to you by your health care provider. Make sure you discuss any questions you have with your health care provider.
